# Patient Record
Sex: Male | Born: 1965 | Race: White | Hispanic: No | Marital: Married | Smoking: Never smoker
Health system: Southern US, Community
[De-identification: ages and names within clinical notes are randomized; demographics above are authoritative.]

## PROBLEM LIST (undated history)

## (undated) DIAGNOSIS — E785 Hyperlipidemia, unspecified: Secondary | ICD-10-CM

## (undated) DIAGNOSIS — J189 Pneumonia, unspecified organism: Secondary | ICD-10-CM

## (undated) DIAGNOSIS — M1990 Unspecified osteoarthritis, unspecified site: Secondary | ICD-10-CM

## (undated) DIAGNOSIS — T7840XA Allergy, unspecified, initial encounter: Secondary | ICD-10-CM

## (undated) HISTORY — DX: Unspecified osteoarthritis, unspecified site: M19.90

## (undated) HISTORY — DX: Hyperlipidemia, unspecified: E78.5

## (undated) HISTORY — DX: Allergy, unspecified, initial encounter: T78.40XA

---
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
## 1989-08-15 HISTORY — PX: DENTAL SURGERY: SHX609

## 2007-08-16 HISTORY — PX: APPENDECTOMY: SHX54

## 2014-11-28 ENCOUNTER — Other Ambulatory Visit: Payer: Self-pay | Admitting: Orthopaedic Surgery

## 2014-11-28 DIAGNOSIS — M542 Cervicalgia: Secondary | ICD-10-CM

## 2014-12-02 ENCOUNTER — Ambulatory Visit
Admission: RE | Admit: 2014-12-02 | Discharge: 2014-12-02 | Disposition: A | Discharge status: Home / Self Care | Payer: 59 | Source: Ambulatory Visit | Attending: Orthopaedic Surgery | Admitting: Orthopaedic Surgery

## 2014-12-02 DIAGNOSIS — M542 Cervicalgia: Secondary | ICD-10-CM

## 2014-12-15 ENCOUNTER — Encounter (HOSPITAL_COMMUNITY): Payer: Self-pay | Admitting: *Deleted

## 2014-12-15 ENCOUNTER — Other Ambulatory Visit (HOSPITAL_COMMUNITY): Payer: Self-pay | Admitting: Specialist

## 2014-12-15 DIAGNOSIS — M5010 Cervical disc disorder with radiculopathy, unspecified cervical region: Secondary | ICD-10-CM

## 2014-12-15 MED ORDER — MUPIROCIN 2 % EX OINT
1.0000 "application " | TOPICAL_OINTMENT | Freq: Once | CUTANEOUS | Status: AC
  Administered 2014-12-16: 1 via TOPICAL

## 2014-12-15 NOTE — Progress Notes (Signed)
No pre-op orders in EPIC. Called Dr. Otho Ket office to request orders, spoke with Judeen Hammans.

## 2014-12-15 NOTE — H&P (Addendum)
Charles Melendez is an 49 y.o. male.   CHIEF COMPLAINT:  This patient was referred by Dr. Joni Fears for evaluation of his neck and left upper extremity.   HISTORY OF PRESENT ILLNESS:  The patient is a 49 year old male complaining of neck pain with radiation to his left upper extremity.  He has been experiencing discomfort in his neck with radiation into his left arm for nearly 8 weeks.  He was placed on a Medrol Dosepak without relief.  He has noticed decreased range of motion of his neck.  Lateral bending and rotation to the left side is severely limited.  He has pain that worsens when he extends his neck and turns to the left side.  He has stiffness in the morning with radiation of the pain into the left lateral elbow, into the left lateral forearm, and into the left dorsal hand and all the fingers.  He has difficulty sleeping.  He has numbness and paresthesias into the left hand that are persistent.  He has had no injury.  He relates that he awoke with increasing discomfort and pain into the arm that worsened over the next several days and has persisted over these last 6 weeks.  He underwent an MRI scan.  This study was done 2 days ago, the results of which indicate a significant disk protrusion with an extruded fragment to the left lateral aspect of the spinal canal at the C6-C7 level affecting primarily the left C7 nerve root and the left lateral aspect of the cord.  His pain is severe.  He relates that he is having difficulties trying to sleep.  He is taking Robaxin without a great deal of relief, but it does seem to take the edge off some.  No bowel or bladder symptoms.  No gait disturbance.  He does feel some weakness in the left arm.  He has had 2 prednisone tapers and has been seen by a chiropractor in the past.  He has had no significant improvement in his pain pattern with these maneuvers.  Apparently, he has seen Dr. Melford Aase over the last 2 months, a chiropractor.  He even saw a massage  therapist in [place], [place].  The stiffness seems to be worsening in his neck.   His primary care physician is Dr. Purcell Nails.   CURRENT MEDICATIONS:  Includes vitamin, CholestOff, fish oil, CoQ10, glucosamine chondroitin sulfate daily, and a prednisone taper that he has had twice over the last 2-3 months.   PAST MEDICAL HISTORY:  Negative for heart disease or lung or kidney disease but significant for the use of glasses and negative for pulmonary disease or asthma.  Negative for ulcer disease or GERD.  He does have a history of intermittent dizzy spells he relates to the inner ear.  No urinary difficulties.  No history of HIV or AIDS.   PAST SURGICAL HISTORY:  He does have a history of a previous appendectomy.   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  He is married.  He does drink alcohol, a glass of wine per day.  He does not smoke and does not chew tobacco.  He does not take recreational drugs.  He has 3 children.   FAMILY HISTORY:  Significant for heart disease, heart attack, high blood pressure, diabetes, and arthritis.       Past Medical History  Diagnosis Date  . Pneumonia     Past Surgical History  Procedure Laterality Date  . Appendectomy  2009  .  Mouth surgery  1986    gum graft    Family History  Problem Relation Age of Onset  . Diabetes type II Father   . Thyroid disease Father    Social History:  reports that he has never smoked. He has never used smokeless tobacco. He reports that he drinks alcohol. He reports that he does not use illicit drugs.  Allergies: No Known Allergies  No prescriptions prior to admission    No results found for this or any previous visit (from the past 48 hour(s)). No results found.  Review of Systems  Constitutional: Negative for fever, chills, weight loss, malaise/fatigue and diaphoresis.  HENT: Negative for congestion, ear discharge, ear pain, hearing loss, nosebleeds, sore throat and tinnitus.   Eyes:  Negative for blurred vision, double vision, photophobia, pain, discharge and redness.  Respiratory: Negative.  Negative for cough, hemoptysis, sputum production, shortness of breath, wheezing and stridor.   Cardiovascular: Negative.  Negative for chest pain, palpitations, orthopnea, claudication, leg swelling and PND.  Gastrointestinal: Negative.  Negative for heartburn, nausea, vomiting, abdominal pain, diarrhea, constipation, blood in stool and melena.  Genitourinary: Negative for dysuria, urgency, frequency, hematuria and flank pain.  Musculoskeletal: Positive for neck pain. Negative for myalgias, back pain, joint pain and falls.  Skin: Negative for itching and rash.  Neurological: Positive for tingling, sensory change, focal weakness and weakness. Negative for dizziness, tremors, speech change, seizures, loss of consciousness and headaches.  Endo/Heme/Allergies: Negative for environmental allergies and polydipsia. Does not bruise/bleed easily.  Psychiatric/Behavioral: Negative for depression, suicidal ideas, hallucinations, memory loss and substance abuse. The patient is not nervous/anxious and does not have insomnia.     There were no vitals taken for this visit. Physical Exam  Constitutional: He is oriented to person, place, and time. He appears well-developed and well-nourished. No distress.  HENT:  Head: Normocephalic and atraumatic.  Right Ear: External ear normal.  Left Ear: External ear normal.  Nose: Nose normal.  Mouth/Throat: Oropharynx is clear and moist. No oropharyngeal exudate.  Eyes: Conjunctivae and EOM are normal. Pupils are equal, round, and reactive to light. Right eye exhibits no discharge. Left eye exhibits no discharge. No scleral icterus.  Neck: Normal range of motion. Neck supple. No JVD present. No tracheal deviation present. No thyromegaly present.  Cardiovascular: Normal rate, regular rhythm, normal heart sounds and intact distal pulses.  Exam reveals no gallop  and no friction rub.   No murmur heard. Respiratory: Effort normal and breath sounds normal. No stridor. No respiratory distress. He has no wheezes. He has no rales. He exhibits no tenderness.  GI: Soft. Bowel sounds are normal. He exhibits no distension and no mass. There is no tenderness. There is no rebound and no guarding.  Musculoskeletal: He exhibits tenderness. He exhibits no edema.  Lymphadenopathy:    He has no cervical adenopathy.  Neurological: He is alert and oriented to person, place, and time. He displays abnormal reflex. No cranial nerve deficit. He exhibits abnormal muscle tone. Coordination normal.  Skin: Skin is warm and dry. No rash noted. He is not diaphoretic. No erythema. No pallor.  Psychiatric: He has a normal mood and affect. His behavior is normal. Judgment and thought content normal.    PHYSICAL EXAMINATION:  His height is 6 feet 2 inches.  His weight is 192 pounds.  BMI is 25.3.  He is awake, alert, and oriented x4.  He is a pleasant, 49 year old male who appears younger than his stated age.  His gait  and heel and toe walk are normal.  He has restricted range of motion of the neck particularly with lateral bending and rotation to the left decreased by about 30%.  Extension of the neck is decreased by about 30-40%.  Forward bending is chin to about a finger breadth and a half from the sternum.  Lateral bending and rotation to the right is decreased by about 15-20%.  Upper extremity motor shows weakness in the left triceps, and this is 4/5.  Left extensor digitorum communis is decreased in strength, and it is 4/5.  Left wrist volar flexion strength is decreased at 4/5.  Sensory is decreased over the dorsal aspect of the left forearm into the long finger.   RADIOGRAPHS/TESTS:  Plain radiographs of the cervical spine on 11/27/2014 with AP and lateral flexion extension views were reviewed.  The AP radiograph shows the spine with a straight appearance.  Degenerative disk narrowing  is evident at the C5-C6 level with uncovertebral spurring evident of the right and left side at C5-C6.  There is some minimal uncovertebral spur on the left side at C3-C4.  The upper lung fields show no abnormality on the AP of the cervical spine of what is able to be seen.  The lateral radiograph of the cervical spine shows degenerative disk space narrowing of the C5-C6 level with an anterior spur evident.  C6-C7 was with some very mild narrowing of the disk space present.  There is some slight straightening of the normal cervical lordotic curve through the mid and lower cervical segments present.  With flexion and extension, no gross abnormal motion was noted across any of the cervical segments.  The soft tissue anterior to the cervical spine appears normal with no significant soft tissue swelling apparent on this lateral radiograph.   The MRI scan report demonstrates uncinate process hypertrophy at C5-C6 with right-sided moderate-to-marked changes and left-sided minimal foraminal narrowing.  His pain is primarily left-sided.  At C6-C7, there is a large focal left posterior lateral extruded disk with mass effect on the left lateral cord on the left side of the C7 nerve roots.  At C7-T1, there are mild degenerative changes.  At T2-T3, there is a disk bulge that touches the ventral aspect of the thecal sac without compression.  Assessment/Plan ASSESSMENT:  This patient has a subacute disk herniation on the left side at C6-C7 that is lateral and approachable from the posterior aspect of the cervical spine.  He has some mild degenerative changes over 3 segments in the cervical spine at C5-C6, C6-C7, and C7-T1.  There are also minimal spurs at C2-C3 and C3-C4.    PLAN:  With this amount of mild degenerative changes, I think that avoiding fusion surgery would be of benefit here.  I believe that the straightening that we see in the lower cervical spine perhaps is related to C5-C6 and the disk space narrowing but  also likely the effect of this protruded disk into the lateral corner of the canal, and he is assuming a position that decreases compression here which would be more of a tendency to flexion of the cervical spine.  I would recommend that Mr. Manahan go ahead and have a posterior cervical foraminotomy on the right side with removal of the herniated disk from a posterior approach as primary portions of this disk herniation are lateral and affecting primarily the C7 nerve root.  There are no long-track findings.  He has no ataxia.  I think that overall he would do  well with decompression alone without fusion.  The risks and benefits of undergoing the procedure were discussed with Mr. Molstad.  He understands, and he wishes to proceed.  The risk of infections is 1 in 300, and the risk of bleeding is minimal of perhaps a teaspoon or two of blood loss.  The risk to the nerve root is perhaps 1 in 5,000, and it is not very great.  There is, however, risks associated with any surgery.  We will go ahead and schedule the patient to undergo the procedure within the next several weeks.  OWENS,JAMES M 12/15/2014, 11:24 PM

## 2014-12-16 ENCOUNTER — Ambulatory Visit (HOSPITAL_COMMUNITY): Payer: 59

## 2014-12-16 ENCOUNTER — Observation Stay (HOSPITAL_COMMUNITY)
Admission: RE | Admit: 2014-12-16 | Discharge: 2014-12-17 | Disposition: A | Discharge status: Home / Self Care | Payer: 59 | Source: Ambulatory Visit | Attending: Specialist | Admitting: Specialist

## 2014-12-16 ENCOUNTER — Ambulatory Visit (HOSPITAL_COMMUNITY): Payer: 59 | Admitting: Certified Registered Nurse Anesthetist

## 2014-12-16 ENCOUNTER — Encounter (HOSPITAL_COMMUNITY): Payer: Self-pay | Admitting: *Deleted

## 2014-12-16 ENCOUNTER — Encounter (HOSPITAL_COMMUNITY)
Admission: RE | Disposition: A | Discharge status: Home / Self Care | Payer: Self-pay | Source: Ambulatory Visit | Attending: Specialist

## 2014-12-16 DIAGNOSIS — Z8701 Personal history of pneumonia (recurrent): Secondary | ICD-10-CM | POA: Insufficient documentation

## 2014-12-16 DIAGNOSIS — M542 Cervicalgia: Secondary | ICD-10-CM | POA: Diagnosis present

## 2014-12-16 DIAGNOSIS — M5012 Cervical disc disorder with radiculopathy, mid-cervical region: Secondary | ICD-10-CM | POA: Diagnosis not present

## 2014-12-16 DIAGNOSIS — M5010 Cervical disc disorder with radiculopathy, unspecified cervical region: Secondary | ICD-10-CM | POA: Insufficient documentation

## 2014-12-16 DIAGNOSIS — F1099 Alcohol use, unspecified with unspecified alcohol-induced disorder: Secondary | ICD-10-CM | POA: Insufficient documentation

## 2014-12-16 DIAGNOSIS — Z419 Encounter for procedure for purposes other than remedying health state, unspecified: Secondary | ICD-10-CM

## 2014-12-16 DIAGNOSIS — M5020 Other cervical disc displacement, unspecified cervical region: Secondary | ICD-10-CM | POA: Diagnosis present

## 2014-12-16 HISTORY — PX: POSTERIOR CERVICAL FUSION/FORAMINOTOMY: SHX5038

## 2014-12-16 HISTORY — DX: Pneumonia, unspecified organism: J18.9

## 2014-12-16 LAB — SURGICAL PCR SCREEN
MRSA, PCR: NEGATIVE
STAPHYLOCOCCUS AUREUS: POSITIVE — AB

## 2014-12-16 LAB — CBC
HCT: 42.2 % (ref 39.0–52.0)
Hemoglobin: 14.5 g/dL (ref 13.0–17.0)
MCH: 30.9 pg (ref 26.0–34.0)
MCHC: 34.4 g/dL (ref 30.0–36.0)
MCV: 90 fL (ref 78.0–100.0)
Platelets: 144 10*3/uL — ABNORMAL LOW (ref 150–400)
RBC: 4.69 MIL/uL (ref 4.22–5.81)
RDW: 13.2 % (ref 11.5–15.5)
WBC: 4.8 10*3/uL (ref 4.0–10.5)

## 2014-12-16 LAB — BASIC METABOLIC PANEL
Anion gap: 12 (ref 5–15)
BUN: 21 mg/dL — ABNORMAL HIGH (ref 6–20)
CO2: 24 mmol/L (ref 22–32)
CREATININE: 0.95 mg/dL (ref 0.61–1.24)
Calcium: 8.6 mg/dL — ABNORMAL LOW (ref 8.9–10.3)
Chloride: 103 mmol/L (ref 101–111)
GFR calc Af Amer: >60 mL/min (ref >60)
GFR calc non Af Amer: >60 mL/min (ref >60)
Glucose, Bld: 88 mg/dL (ref 70–99)
POTASSIUM: 4.5 mmol/L (ref 3.5–5.1)
Sodium: 139 mmol/L (ref 135–145)

## 2014-12-16 SURGERY — POSTERIOR CERVICAL FUSION/FORAMINOTOMY LEVEL 1
Anesthesia: General | Site: Neck

## 2014-12-16 MED ORDER — KETOROLAC TROMETHAMINE 30 MG/ML IJ SOLN
INTRAMUSCULAR | Status: AC
  Filled 2014-12-16: qty 1

## 2014-12-16 MED ORDER — ONDANSETRON HCL 4 MG/2ML IJ SOLN
4.0000 mg | INTRAMUSCULAR | Status: DC | PRN
Start: 2014-12-16 — End: 2014-12-17

## 2014-12-16 MED ORDER — THROMBIN 20000 UNITS EX KIT
PACK | CUTANEOUS | Status: DC | PRN
  Administered 2014-12-16: 20000 [IU] via TOPICAL

## 2014-12-16 MED ORDER — MENTHOL 3 MG MT LOZG: 1.0000 | LOZENGE | OROMUCOSAL | Status: DC | PRN

## 2014-12-16 MED ORDER — DOCUSATE SODIUM 100 MG PO CAPS
100.0000 mg | ORAL_CAPSULE | Freq: Two times a day (BID) | ORAL | Status: DC
  Administered 2014-12-16: 100 mg via ORAL
  Filled 2014-12-16: qty 1

## 2014-12-16 MED ORDER — GLYCOPYRROLATE 0.2 MG/ML IJ SOLN
INTRAMUSCULAR | Status: DC | PRN
  Administered 2014-12-16: 0.6 mg via INTRAVENOUS

## 2014-12-16 MED ORDER — BISACODYL 5 MG PO TBEC
5.0000 mg | DELAYED_RELEASE_TABLET | Freq: Every day | ORAL | Status: DC | PRN
  Filled 2014-12-16: qty 1

## 2014-12-16 MED ORDER — ROCURONIUM BROMIDE 100 MG/10ML IV SOLN
INTRAVENOUS | Status: DC | PRN
  Administered 2014-12-16: 50 mg via INTRAVENOUS

## 2014-12-16 MED ORDER — SODIUM CHLORIDE 0.9 % IJ SOLN: 3.0000 mL | INTRAMUSCULAR | Status: DC | PRN

## 2014-12-16 MED ORDER — MORPHINE SULFATE 2 MG/ML IJ SOLN
1.0000 mg | INTRAMUSCULAR | Status: DC | PRN
  Administered 2014-12-16: 2 mg via INTRAVENOUS
  Filled 2014-12-16: qty 1

## 2014-12-16 MED ORDER — POLYETHYLENE GLYCOL 3350 17 G PO PACK
17.0000 g | PACK | Freq: Every day | ORAL | Status: DC | PRN
Start: 2014-12-16 — End: 2014-12-17
  Filled 2014-12-16: qty 1

## 2014-12-16 MED ORDER — KETOROLAC TROMETHAMINE 30 MG/ML IJ SOLN
30.0000 mg | Freq: Once | INTRAMUSCULAR | Status: AC
  Administered 2014-12-16: 30 mg via INTRAVENOUS

## 2014-12-16 MED ORDER — MIDAZOLAM HCL 5 MG/5ML IJ SOLN
INTRAMUSCULAR | Status: DC | PRN
  Administered 2014-12-16: 2 mg via INTRAVENOUS

## 2014-12-16 MED ORDER — SODIUM CHLORIDE 0.9 % IJ SOLN
3.0000 mL | Freq: Two times a day (BID) | INTRAMUSCULAR | Status: DC
  Administered 2014-12-16: 3 mL via INTRAVENOUS

## 2014-12-16 MED ORDER — ONDANSETRON HCL 4 MG/2ML IJ SOLN: 4.0000 mg | Freq: Four times a day (QID) | INTRAMUSCULAR | Status: DC | PRN

## 2014-12-16 MED ORDER — BUPIVACAINE HCL 0.5 % IJ SOLN
INTRAMUSCULAR | Status: DC | PRN
  Administered 2014-12-16: 4 mL

## 2014-12-16 MED ORDER — PHENYLEPHRINE HCL 10 MG/ML IJ SOLN
INTRAMUSCULAR | Status: DC | PRN
  Administered 2014-12-16 (×4): 80 ug via INTRAVENOUS

## 2014-12-16 MED ORDER — KCL IN DEXTROSE-NACL 10-5-0.45 MEQ/L-%-% IV SOLN
INTRAVENOUS | Status: DC
  Filled 2014-12-16 (×2): qty 1000

## 2014-12-16 MED ORDER — ACETAMINOPHEN 325 MG PO TABS: 650.0000 mg | ORAL_TABLET | ORAL | Status: DC | PRN

## 2014-12-16 MED ORDER — CEFAZOLIN SODIUM-DEXTROSE 2-3 GM-% IV SOLR
INTRAVENOUS | Status: DC | PRN
  Administered 2014-12-16: 2 g via INTRAVENOUS

## 2014-12-16 MED ORDER — LACTATED RINGERS IV SOLN
INTRAVENOUS | Status: DC
  Administered 2014-12-16 (×3): via INTRAVENOUS

## 2014-12-16 MED ORDER — FENTANYL CITRATE (PF) 250 MCG/5ML IJ SOLN
INTRAMUSCULAR | Status: AC
  Filled 2014-12-16: qty 5

## 2014-12-16 MED ORDER — ONDANSETRON HCL 4 MG/2ML IJ SOLN
INTRAMUSCULAR | Status: DC | PRN
  Administered 2014-12-16: 4 mg via INTRAVENOUS

## 2014-12-16 MED ORDER — HYDROMORPHONE HCL 1 MG/ML IJ SOLN
INTRAMUSCULAR | Status: AC
  Filled 2014-12-16: qty 1

## 2014-12-16 MED ORDER — PROPOFOL 10 MG/ML IV BOLUS
INTRAVENOUS | Status: AC
  Filled 2014-12-16: qty 20

## 2014-12-16 MED ORDER — METHOCARBAMOL 1000 MG/10ML IJ SOLN
500.0000 mg | Freq: Four times a day (QID) | INTRAVENOUS | Status: DC | PRN
  Filled 2014-12-16: qty 5

## 2014-12-16 MED ORDER — FLEET ENEMA 7-19 GM/118ML RE ENEM
1.0000 | ENEMA | Freq: Once | RECTAL | Status: AC | PRN
  Filled 2014-12-16: qty 1

## 2014-12-16 MED ORDER — MUPIROCIN 2 % EX OINT
TOPICAL_OINTMENT | CUTANEOUS | Status: AC
  Filled 2014-12-16: qty 22

## 2014-12-16 MED ORDER — ACETAMINOPHEN 650 MG RE SUPP: 650.0000 mg | RECTAL | Status: DC | PRN

## 2014-12-16 MED ORDER — PROPOFOL 10 MG/ML IV BOLUS
INTRAVENOUS | Status: DC | PRN
  Administered 2014-12-16: 200 mg via INTRAVENOUS

## 2014-12-16 MED ORDER — BUPIVACAINE LIPOSOME 1.3 % IJ SUSP
20.0000 mL | Freq: Once | INTRAMUSCULAR | Status: AC
  Administered 2014-12-16: 4 mL
  Filled 2014-12-16: qty 20

## 2014-12-16 MED ORDER — THROMBIN 20000 UNITS EX SOLR
CUTANEOUS | Status: AC
  Filled 2014-12-16: qty 20000

## 2014-12-16 MED ORDER — HYDROMORPHONE HCL 1 MG/ML IJ SOLN
0.2500 mg | INTRAMUSCULAR | Status: DC | PRN
  Administered 2014-12-16: 0.25 mg via INTRAVENOUS

## 2014-12-16 MED ORDER — LIDOCAINE HCL (CARDIAC) 20 MG/ML IV SOLN
INTRAVENOUS | Status: DC | PRN
  Administered 2014-12-16: 60 mg via INTRAVENOUS

## 2014-12-16 MED ORDER — MIDAZOLAM HCL 2 MG/2ML IJ SOLN
INTRAMUSCULAR | Status: AC
  Filled 2014-12-16: qty 2

## 2014-12-16 MED ORDER — PHENYLEPHRINE HCL 10 MG/ML IJ SOLN
10.0000 mg | INTRAMUSCULAR | Status: DC | PRN
  Administered 2014-12-16: 25 ug/min via INTRAVENOUS

## 2014-12-16 MED ORDER — HYDROCODONE-ACETAMINOPHEN 5-325 MG PO TABS: 1.0000 | ORAL_TABLET | ORAL | Status: DC | PRN

## 2014-12-16 MED ORDER — FENTANYL CITRATE (PF) 100 MCG/2ML IJ SOLN
INTRAMUSCULAR | Status: DC | PRN
  Administered 2014-12-16: 150 ug via INTRAVENOUS
  Administered 2014-12-16: 100 ug via INTRAVENOUS

## 2014-12-16 MED ORDER — CEFAZOLIN SODIUM 1-5 GM-% IV SOLN
1.0000 g | Freq: Three times a day (TID) | INTRAVENOUS | Status: AC
  Administered 2014-12-16 – 2014-12-17 (×2): 1 g via INTRAVENOUS
  Filled 2014-12-16 (×2): qty 50

## 2014-12-16 MED ORDER — OXYCODONE-ACETAMINOPHEN 5-325 MG PO TABS
1.0000 | ORAL_TABLET | ORAL | Status: DC | PRN
  Administered 2014-12-16 – 2014-12-17 (×4): 2 via ORAL
  Filled 2014-12-16 (×4): qty 2

## 2014-12-16 MED ORDER — METHOCARBAMOL 500 MG PO TABS
500.0000 mg | ORAL_TABLET | Freq: Four times a day (QID) | ORAL | Status: DC | PRN
  Administered 2014-12-16 – 2014-12-17 (×3): 500 mg via ORAL
  Filled 2014-12-16 (×3): qty 1

## 2014-12-16 MED ORDER — OXYCODONE HCL 5 MG/5ML PO SOLN: 5.0000 mg | Freq: Once | ORAL | Status: DC | PRN

## 2014-12-16 MED ORDER — VECURONIUM BROMIDE 10 MG IV SOLR
INTRAVENOUS | Status: DC | PRN
  Administered 2014-12-16: 1 mg via INTRAVENOUS
  Administered 2014-12-16: 3 mg via INTRAVENOUS

## 2014-12-16 MED ORDER — OXYCODONE HCL 5 MG PO TABS: 5.0000 mg | ORAL_TABLET | Freq: Once | ORAL | Status: DC | PRN

## 2014-12-16 MED ORDER — PHENOL 1.4 % MT LIQD: 1.0000 | OROMUCOSAL | Status: DC | PRN

## 2014-12-16 MED ORDER — NEOSTIGMINE METHYLSULFATE 10 MG/10ML IV SOLN
INTRAVENOUS | Status: DC | PRN
  Administered 2014-12-16: 5 mg via INTRAVENOUS

## 2014-12-16 MED ORDER — SODIUM CHLORIDE 0.9 % IV SOLN: 250.0000 mL | INTRAVENOUS | Status: DC

## 2014-12-16 SURGICAL SUPPLY — 58 items
BIT DRILL NEURO 2X3.1 SFT TUCH (MISCELLANEOUS) IMPLANT
BLADE SURG ROTATE 9660 (MISCELLANEOUS) IMPLANT
BUR ROUND FLUTED 4 SOFT TCH (BURR) ×2 IMPLANT
BUR ROUND FLUTED 4MM SOFT TCH (BURR) ×1
BUR SABER RD CUTTING 3.0 (BURR) ×2 IMPLANT
BUR SABER RD CUTTING 3.0MM (BURR) ×1
CANISTER SUCTION 2500CC (MISCELLANEOUS) ×3 IMPLANT
COLLAR CERV LO CONTOUR FIRM DE (SOFTGOODS) ×3 IMPLANT
COVER SURGICAL LIGHT HANDLE (MISCELLANEOUS) ×3 IMPLANT
DERMABOND ADVANCED (GAUZE/BANDAGES/DRESSINGS) ×2
DERMABOND ADVANCED .7 DNX12 (GAUZE/BANDAGES/DRESSINGS) ×1 IMPLANT
DRAPE C-ARM 42X72 X-RAY (DRAPES) ×3 IMPLANT
DRAPE LAPAROTOMY T 102X78X121 (DRAPES) ×3 IMPLANT
DRAPE MICROSCOPE LEICA (MISCELLANEOUS) IMPLANT
DRAPE PROXIMA HALF (DRAPES) ×6 IMPLANT
DRAPE SURG 17X23 STRL (DRAPES) ×12 IMPLANT
DRILL NEURO 2X3.1 SOFT TOUCH (MISCELLANEOUS)
DRSG MEPILEX BORDER 4X4 (GAUZE/BANDAGES/DRESSINGS) ×3 IMPLANT
DRSG MEPILEX BORDER 4X8 (GAUZE/BANDAGES/DRESSINGS) IMPLANT
DRSG PAD ABDOMINAL 8X10 ST (GAUZE/BANDAGES/DRESSINGS) ×6 IMPLANT
DURAPREP 6ML APPLICATOR 50/CS (WOUND CARE) ×3 IMPLANT
ELECT CAUTERY BLADE 6.4 (BLADE) ×3 IMPLANT
ELECT REM PT RETURN 9FT ADLT (ELECTROSURGICAL) ×3
ELECTRODE REM PT RTRN 9FT ADLT (ELECTROSURGICAL) ×1 IMPLANT
EVACUATOR 1/8 PVC DRAIN (DRAIN) IMPLANT
GAUZE SPONGE 4X4 12PLY STRL (GAUZE/BANDAGES/DRESSINGS) ×3 IMPLANT
GLOVE BIOGEL PI IND STRL 8 (GLOVE) ×1 IMPLANT
GLOVE BIOGEL PI INDICATOR 8 (GLOVE) ×2
GLOVE ECLIPSE 9.0 STRL (GLOVE) ×3 IMPLANT
GLOVE ORTHO TXT STRL SZ7.5 (GLOVE) ×3 IMPLANT
GLOVE SURG 8.5 LATEX PF (GLOVE) ×3 IMPLANT
GOWN STRL REUS W/ TWL LRG LVL3 (GOWN DISPOSABLE) ×1 IMPLANT
GOWN STRL REUS W/TWL 2XL LVL3 (GOWN DISPOSABLE) ×6 IMPLANT
GOWN STRL REUS W/TWL LRG LVL3 (GOWN DISPOSABLE) ×2
KIT BASIN OR (CUSTOM PROCEDURE TRAY) ×3 IMPLANT
KIT ROOM TURNOVER OR (KITS) ×3 IMPLANT
NEEDLE SPNL 18GX3.5 QUINCKE PK (NEEDLE) ×3 IMPLANT
NS IRRIG 1000ML POUR BTL (IV SOLUTION) ×3 IMPLANT
PACK ORTHO CERVICAL (CUSTOM PROCEDURE TRAY) ×3 IMPLANT
PAD ARMBOARD 7.5X6 YLW CONV (MISCELLANEOUS) ×6 IMPLANT
PATTIES SURGICAL .25X.25 (GAUZE/BANDAGES/DRESSINGS) ×3 IMPLANT
PATTIES SURGICAL .75X.75 (GAUZE/BANDAGES/DRESSINGS) IMPLANT
SPONGE SURGIFOAM ABS GEL 100 (HEMOSTASIS) ×3 IMPLANT
SUT ETHIBOND CT1 BRD #0 30IN (SUTURE) IMPLANT
SUT ETHIBOND NAB CT1 #1 30IN (SUTURE) ×3 IMPLANT
SUT VIC AB 0 CT1 27 (SUTURE)
SUT VIC AB 0 CT1 27XBRD ANBCTR (SUTURE) IMPLANT
SUT VIC AB 2-0 CT1 27 (SUTURE)
SUT VIC AB 2-0 CT1 TAPERPNT 27 (SUTURE) IMPLANT
SUT VIC AB 2-0 UR6 27 (SUTURE) IMPLANT
SUT VIC AB 3-0 SH 27 (SUTURE) ×2
SUT VIC AB 3-0 SH 27X BRD (SUTURE) ×1 IMPLANT
SUT VICRYL 0 UR6 27IN ABS (SUTURE) ×3 IMPLANT
TAPE CLOTH 4X10 WHT NS (GAUZE/BANDAGES/DRESSINGS) ×3 IMPLANT
TOWEL OR 17X24 6PK STRL BLUE (TOWEL DISPOSABLE) ×3 IMPLANT
TOWEL OR 17X26 10 PK STRL BLUE (TOWEL DISPOSABLE) ×3 IMPLANT
TRAY FOLEY CATH 16FRSI W/METER (SET/KITS/TRAYS/PACK) IMPLANT
WATER STERILE IRR 1000ML POUR (IV SOLUTION) IMPLANT

## 2014-12-16 NOTE — Interval H&P Note (Signed)
History and Physical Interval Note:  12/16/2014 10:53 AM  Charles Melendez  has presented today for surgery, with the diagnosis of left C6-7 herniated nucleus pulposus  The various methods of treatment have been discussed with the patient and family. After consideration of risks, benefits and other options for treatment, the patient has consented to  Procedure(s): LEFT C6-7 POSTERIOR CERVICAL FORAMINOTOMY WITH EXCISION OF HERNIATED DISC (N/A) as a surgical intervention .  The patient's history has been reviewed, patient examined, no change in status, stable for surgery.  I have reviewed the patient's chart and labs.  Questions were answered to the patient's satisfaction.     [name] E

## 2014-12-16 NOTE — Discharge Instructions (Signed)
No lifting greater than 10 lbs. °Avoid bending, stooping and twisting. °Walking in house for first week then may start to get out slowly increasing activity using arms. °Keep incision dry for 3 days, may use tegaderm or similar water impervious dressing. °Avoid overhead use of arms and overhead lifting. °Wear collar for comfort. °Use ice as needed for comfort. °

## 2014-12-16 NOTE — Op Note (Signed)
12/16/2014  1:20 PM  PATIENT:  Charles Melendez  49 y.o. male  MRN: [id number]  OPERATIVE REPORT  PRE-OPERATIVE DIAGNOSIS:  left C6-7 herniated nucleus pulposus  POST-OPERATIVE DIAGNOSIS:  left C6-7 herniated nucleus pulposus  PROCEDURE:  Procedure(s): LEFT C6-7 POSTERIOR CERVICAL FORAMINOTOMY WITH EXCISION OF HERNIATED DISC OR Microscope.    SURGEON:  Jessy Oto, MD     ASSISTANT:  Benjiman Core, PA-C  (Present throughout the entire procedure and necessary for completion of procedure in a timely manner)     ANESTHESIA:  General,supplemented with local marcaine 0.5% 1:1 exparel 1.3% total 30 cc. Dr. Marcie Bal  EBL: 75 cc.  DRAINS: None.    COMPLICATIONS:  None.   PROCEDURE:The patient was met in the holding area, and the appropriate left C6-7 cervical level identified and marked with "x" and my initials. All questions were answered and informed consent signed.   The patient was then transported to OR. The patient was then placed under general anesthesia without difficulty.Transferred to the operating room table prone position Mayfield horseshoe with chest rolls. All pressure points well-padded PAS stockings.Shoulders taped down and skin over the posterior inferior aspect of the neck place in traction to decrease skin folds. The patient received appropriate preoperative antibiotic 2 grams ancef. prophylaxis.Time-out procedure was called and correct.  Sterile prep with DuraPrep and draped in the usual manner the shoulders were taped downwards and skin traction over the skin of the neck. Following DuraPrep draped in the usual manner. After timeout protocol, a 25 gage needle inserted and C-arm demonstrated this at the C6 skin level, incision was made approximately C6 in the midline extending distally 3 cm. This following infiltration of skin and subcutaneous layers withmarcaine 0.5% 1:1 exparel 1.3% total of 20 cc. Incision carried through skin and subcutaneous layers using 10 blade scalpel  and electrocautery down to the level ligamentum nuchae. Incision made along the lateral aspect of the spinous process of C6. Clamp then placed at the spinous process of C6. Intraoperative C-arm fluoroscopy identified the clamp at the inferior C6 spinous process level. Then a marking pen was used to mark the spinous process of C6. Electrocautery then used to carefully incise the cervical muscles off the left lateral aspect of the spinous process of C6 and C7. Dividing the  spinous muscles off of the inferior aspect lamina at C6 exposing the C6-7 posterior aspect of the interlaminar space. Further infiltration of the ligamentumnuche and paracervical muscles with marcaine 0.5% 1:1 exparel 1.3% 10 cc. The magnification headlamp were used during this portion procedure. Boss McCollough retractor was inserted. High-speed bur was used to remove a small portion of bone from the inferior aspect of lamina of C7 and the medial 20% of the inferior articular process of C6. Further thinning the superior aspect of the lamina left C7. A 1 mm Kerrison was then used to remove him from superior aspect of the lamina C7 and the medial aspect of the intra-articular process of C6 the 20%, exposing the superior articular process of C7. A 1 mm Kerrison was removed bone off the medial aspect of the superior articular process of C7 resecting 20% of the medial aspect of the superior articular process of C7. Ligamentum flavum then easily lifted superiorly  electrocautery unit cauterizing epidural veins deep to the ligamentum flavum  then resecting the ligamentum flavum. The operating room microscope was draped sterilely and brought into the field. Under the operating room microscope the epidural vein layer overlying the posterior aspect of the thecal  sac and the C7 nerve root was then carefully lifted using a micro-titanium nerve hook then cauterized using bipolar electrocautery a # 15 blade scalpel then used to incise this overlying the C7  nerve root releasing the vascular leash a backward angle 3-0 microcurette then used to remove a small portion of bone off the superior and medial aspect of the pedicle further mobilizing the C7 nerve root bipolar electrocautery to control all bleeding within the axillary area of the C7 nerve. Bone wax was applied to bleeding cancellus bone surfaces are excellent hemostasis obtained  The disc explored using a Penfield 4 found to be protruded. 11 blade scalpel used to incise the disc mild disc material found to be herniated and uncovertebral joint found to be prominent resulting in right C7 foramenal narrowing.Three large fragments of disc material removed with micropituitary.  Following this then hemostasis was obtained using thrombin-soaked Gelfoam and micro-pledgettes. When complete hemostasis was obtained all gel foam was removed the nerve hook could be easily passed out the neuroforamen without the lateral aspect of the C7 pedicle demonstrate the C7 neuroforamen completely decompressed. Irrigation was carried out no active bleeding was present. The incision was closed by approximating the ligamentum nuchae with O ethibond sutures. The subcutaneous layers approximated with interrupted 0 Vicryl suture more superficial layers with interrupted 2-0 Vicryl sutures and the skin closed with interrupted 4-0 Vicryl sutures. Dermabond was applied then MedPlex bandage. Soft cervical collar placed.  All instrument and sponge counts were correct. Patient was then returned to supine position on his stretcher. Returned to recovery room in satisfactory condition.    Physician assistant's responsibilities: Benjiman Core, PA-C perform the duties of assistant physician and surgeon during this case present from the beginning of the case to the end of the case. He assisted with careful retraction of neural structures suctioning about her elements including cervical cord and C7 nerve root. Performed closure of the incision on the  ligamentum nuchae to the skin and application of dressing. He assisted in positioning the patient had removal the patient from the OR table to the stretcher.     NITKA,JAMES E 12/16/2014, 1:20 PM

## 2014-12-16 NOTE — Anesthesia Preprocedure Evaluation (Signed)
Anesthesia Evaluation  Patient identified by MRN, date of birth, ID band Patient awake    Reviewed: Allergy & Precautions, NPO status , Patient's Chart, lab work & pertinent test results  Airway Mallampati: II   Neck ROM: full    Dental   Pulmonary neg pulmonary ROS,  breath sounds clear to auscultation        Cardiovascular negative cardio ROS  Rhythm:regular Rate:Normal     Neuro/Psych    GI/Hepatic   Endo/Other    Renal/GU      Musculoskeletal   Abdominal   Peds  Hematology   Anesthesia Other Findings   Reproductive/Obstetrics                             Anesthesia Physical Anesthesia Plan  ASA: I  Anesthesia Plan: General   Post-op Pain Management:    Induction: Intravenous  Airway Management Planned: Oral ETT  Additional Equipment:   Intra-op Plan:   Post-operative Plan: Extubation in OR  Informed Consent: I have reviewed the patients History and Physical, chart, labs and discussed the procedure including the risks, benefits and alternatives for the proposed anesthesia with the patient or authorized representative who has indicated his/her understanding and acceptance.     Plan Discussed with: CRNA, Anesthesiologist and Surgeon  Anesthesia Plan Comments:         Anesthesia Quick Evaluation

## 2014-12-16 NOTE — Transfer of Care (Signed)
Immediate Anesthesia Transfer of Care Note  Patient: Charles Melendez  Procedure(s) Performed: Procedure(s): LEFT C6-7 POSTERIOR CERVICAL FORAMINOTOMY WITH EXCISION OF HERNIATED DISC (N/A)  Patient Location: PACU  Anesthesia Type:General  Level of Consciousness: awake, alert , oriented and patient cooperative  Airway & Oxygen Therapy: Patient Spontanous Breathing and Patient connected to nasal cannula oxygen  Post-op Assessment: Report given to RN, Post -op Vital signs reviewed and stable, Patient moving all extremities, Patient moving all extremities X 4 and Patient able to stick tongue midline  Post vital signs: Reviewed and stable  Last Vitals:  Filed Vitals:   12/16/14 0845  BP: 144/90  Pulse: 67  Temp: 37 C  Resp: 18    Complications: No apparent anesthesia complications

## 2014-12-16 NOTE — Anesthesia Procedure Notes (Signed)
Procedure Name: Intubation Date/Time: 12/16/2014 11:05 AM Performed by: Shirlyn Goltz Pre-anesthesia Checklist: Patient identified, Emergency Drugs available, Suction available and Patient being monitored Patient Re-evaluated:Patient Re-evaluated prior to inductionOxygen Delivery Method: Circle system utilized Preoxygenation: Pre-oxygenation with 100% oxygen Intubation Type: IV induction Ventilation: Mask ventilation without difficulty and Oral airway inserted - appropriate to patient size Laryngoscope Size: Mac and 4 Grade View: Grade I Tube type: Oral Tube size: 7.5 mm Number of attempts: 1 Airway Equipment and Method: Stylet Placement Confirmation: ETT inserted through vocal cords under direct vision,  positive ETCO2 and breath sounds checked- equal and bilateral Secured at: 22 cm Tube secured with: Tape Dental Injury: Teeth and Oropharynx as per pre-operative assessment

## 2014-12-16 NOTE — Brief Op Note (Addendum)
12/16/2014  1:15 PM  PATIENT:  Charles Melendez  49 y.o. male  PRE-OPERATIVE DIAGNOSIS:  left C6-7 herniated nucleus pulposus  POST-OPERATIVE DIAGNOSIS:  left C6-7 herniated nucleus pulposus  PROCEDURE:  Procedure(s): LEFT C6-7 POSTERIOR CERVICAL FORAMINOTOMY WITH EXCISION OF HERNIATED DISC (N/A)  SURGEON:  Surgeon(s) and Role:    * Jessy Oto, MD - Primary  PHYSICIAN ASSISTANT: Benjiman Core, PA-C   ANESTHESIA:   local and general, Dr.Hodierne.  EBL:  Total I/O In: 1000 [I.V.:1000] Out: 100 [Blood:100]  BLOOD ADMINISTERED:none  DRAINS: none   LOCAL MEDICATIONS USED:  MARCAINE 1/2% 1:1 EXPAREL 1.3% Amount:30 ml  SPECIMEN:  No Specimen  DISPOSITION OF SPECIMEN:  N/A  COUNTS:  YES  TOURNIQUET:  * No tourniquets in log *  DICTATION: .Dragon Dictation  PLAN OF CARE: Admit for overnight observation  PATIENT DISPOSITION:  PACU - hemodynamically stable.   Delay start of Pharmacological VTE agent (>24hrs) due to surgical blood loss or risk of bleeding: yes

## 2014-12-16 NOTE — Progress Notes (Signed)
Orthopedic Tech Progress Note Patient Details:  Charles Melendez 04-13-1966 [id number] Patient already has collar. Patient ID: Charles Melendez, male   DOB: 16-Oct-1965, 49 y.o.   MRN: [id number]   Charles Melendez 12/16/2014, 4:07 PM

## 2014-12-16 NOTE — Anesthesia Postprocedure Evaluation (Signed)
  Anesthesia Post-op Note  Patient: Charles Melendez  Procedure(s) Performed: Procedure(s) (LRB): LEFT C6-7 POSTERIOR CERVICAL FORAMINOTOMY WITH EXCISION OF HERNIATED DISC (N/A)  Patient Location: PACU  Anesthesia Type: General  Level of Consciousness: awake and alert   Airway and Oxygen Therapy: Patient Spontanous Breathing  Post-op Pain: mild  Post-op Assessment: Post-op Vital signs reviewed, Patient's Cardiovascular Status Stable, Respiratory Function Stable, Patent Airway and No signs of Nausea or vomiting  Last Vitals:  Filed Vitals:   12/16/14 1333  BP: 125/81  Pulse: 62  Temp: 36.6 C  Resp: 17    Post-op Vital Signs: stable   Complications: No apparent anesthesia complications

## 2014-12-17 ENCOUNTER — Encounter (HOSPITAL_COMMUNITY): Payer: Self-pay | Admitting: Specialist

## 2014-12-17 DIAGNOSIS — M5012 Cervical disc disorder with radiculopathy, mid-cervical region: Secondary | ICD-10-CM | POA: Diagnosis not present

## 2014-12-17 MED ORDER — METHOCARBAMOL 500 MG PO TABS: 500.0000 mg | ORAL_TABLET | Freq: Four times a day (QID) | ORAL | Status: DC | PRN

## 2014-12-17 MED ORDER — OXYCODONE-ACETAMINOPHEN 5-325 MG PO TABS: 1.0000 | ORAL_TABLET | ORAL | Status: DC | PRN

## 2014-12-17 MED ORDER — MUPIROCIN 2 % EX OINT: 1.0000 "application " | TOPICAL_OINTMENT | Freq: Two times a day (BID) | CUTANEOUS | Status: DC

## 2014-12-17 MED ORDER — DOCUSATE SODIUM 100 MG PO CAPS: 100.0000 mg | ORAL_CAPSULE | Freq: Two times a day (BID) | ORAL | Status: DC

## 2014-12-17 MED FILL — Thrombin For Soln 20000 Unit: CUTANEOUS | Qty: 1 | Status: AC

## 2014-12-17 NOTE — Progress Notes (Signed)
Pt doing well. Pt given D/C instructions with Rx's, verbal understanding was provided. Pt's incision is covered with dressing and has no sign of infection. Pt's IV was removed prior to D/C. Pt D/C'd home via walking @ 1045 per MD order. Pt is stable @ D/C and has no other needs at this time. Holli Humbles, RN

## 2014-12-17 NOTE — Discharge Summary (Signed)
Physician Discharge Summary      Patient ID: Charles Melendez MRN: [id number] DOB/AGE: 1965/12/11 49 y.o.  Admit date: 12/16/2014 Discharge date:   Admission Diagnoses:  Principal Problem:   Herniation of cervical intervertebral disc with radiculopathy Active Problems:   HNP (herniated nucleus pulposus), cervical   Discharge Diagnoses:  Same  Past Medical History  Diagnosis Date  . Pneumonia     Surgeries: Procedure(s): LEFT C6-7 POSTERIOR CERVICAL FORAMINOTOMY WITH EXCISION OF HERNIATED DISC on 12/16/2014   Consultants:    Discharged Condition: Improved  Hospital Course: Charles Melendez is an 49 y.o. male who was admitted 12/16/2014 with a chief complaint of No chief complaint on file. , and found to have a diagnosis of Herniation of cervical intervertebral disc with radiculopathy.  He was brought to the operating room on 12/16/2014 and underwent the above named procedures.    He was given perioperative antibiotics:  Anti-infectives    Start     Dose/Rate Route Frequency Ordered Stop   12/16/14 1830  ceFAZolin (ANCEF) IVPB 1 g/50 mL premix     1 g 100 mL/hr over 30 Minutes Intravenous Every 8 hours 12/16/14 1529 12/17/14 0351    .  He was given sequential compression devices, early ambulation, and chemoprophylaxis for DVT prophylaxis. POD#1 alert and orientedx 4, motor normal, incision dry, voiding without difficulty, no narcotics requested or needed since 3AM. Discharged home on POD#1. All questions answered prior to discharge. He benefited maximally from their hospital stay and there were no complications.    Recent vital signs:  Filed Vitals:   12/17/14 0400  BP: 98/66  Pulse: 65  Temp: 98.1 F (36.7 C)  Resp: 20    Recent laboratory studies:  Results for orders placed or performed during the hospital encounter of 12/16/14  Surgical pcr screen  Result Value Ref Range   MRSA, PCR NEGATIVE NEGATIVE   Staphylococcus aureus POSITIVE (A) NEGATIVE  CBC    Result Value Ref Range   WBC 4.8 4.0 - 10.5 K/uL   RBC 4.69 4.22 - 5.81 MIL/uL   Hemoglobin 14.5 13.0 - 17.0 g/dL   HCT 42.2 39.0 - 52.0 %   MCV 90.0 78.0 - 100.0 fL   MCH 30.9 26.0 - 34.0 pg   MCHC 34.4 30.0 - 36.0 g/dL   RDW 13.2 11.5 - 15.5 %   Platelets 144 (L) 150 - 400 K/uL  Basic metabolic panel  Result Value Ref Range   Sodium 139 135 - 145 mmol/L   Potassium 4.5 3.5 - 5.1 mmol/L   Chloride 103 101 - 111 mmol/L   CO2 24 22 - 32 mmol/L   Glucose, Bld 88 70 - 99 mg/dL   BUN 21 (H) 6 - 20 mg/dL   Creatinine, Ser 0.95 0.61 - 1.24 mg/dL   Calcium 8.6 (L) 8.9 - 10.3 mg/dL   GFR calc non Af Amer >60 >60 mL/min   GFR calc Af Amer >60 >60 mL/min   Anion gap 12 5 - 15    Discharge Medications:     Medication List    STOP taking these medications        HYDROcodone-acetaminophen 5-325 MG per tablet  Commonly known as:  NORCO/VICODIN      TAKE these medications        CHOLEST OFF PO  Take 1 Dose by mouth daily.     COQ10 PO  Take 1 tablet by mouth daily.     docusate sodium 100 MG capsule  Commonly  known as:  COLACE  Take 1 capsule (100 mg total) by mouth 2 (two) times daily.     Fish Oil 1000 MG Caps  Take 1,000 mg by mouth daily.     glucosamine-chondroitin 500-400 MG tablet  Take 1 tablet by mouth daily.     methocarbamol 500 MG tablet  Commonly known as:  ROBAXIN  Take 500 mg by mouth every 6 (six) hours as needed. spasms     methocarbamol 500 MG tablet  Commonly known as:  ROBAXIN  Take 1 tablet (500 mg total) by mouth every 6 (six) hours as needed for muscle spasms.     multivitamin with minerals tablet  Take 1 tablet by mouth daily.     oxyCODONE-acetaminophen 5-325 MG per tablet  Commonly known as:  PERCOCET/ROXICET  Take 1-2 tablets by mouth every 4 (four) hours as needed for moderate pain.        Diagnostic Studies: Dg Cervical Spine 2-3 Views  12/16/2014   CLINICAL DATA:  Confirm C6 marker location  EXAM: CERVICAL SPINE - 2-3 VIEW   COMPARISON:  None.  FINDINGS: Two intraoperative spot images are submitted. These demonstrate a posterior surgical instrument which appears to be along the inferior aspect of the C6 spinous process. Overlying shoulders obscures the C6 and C7 levels.  IMPRESSION: Posterior surgical instrument appears to be along the inferior aspect of C6 spinous process.   Electronically Signed   By: Rolm Baptise M.D.   On: 12/16/2014 12:12   Mr Cervical Spine Wo Contrast  12/02/2014   CLINICAL DATA:  49 year old male with neck and left shoulder tightness. Left arm pain. Numbness and tingling left arm and hand. Initial encounter.  EXAM: MRI CERVICAL SPINE WITHOUT CONTRAST  TECHNIQUE: Multiplanar, multisequence MR imaging of the cervical spine was performed. No intravenous contrast was administered.  COMPARISON:  None.  FINDINGS: Cervical medullary junction and visualized intracranial structures unremarkable. No focal cervical cord signal abnormality.  Mild kyphosis of the cervical spine centered at the C5-6 level.  Visualized paravertebral structures are unremarkable.  C2-3: Minimal spur left posterior lateral position with minimal narrowing origin of the left neural foramen.  C3-4: Minimal bulge. Minimal narrowing ventral aspect of the thecal sac. Minimal uncinate hypertrophy. Mild left facet joint degenerative changes. Mild to moderate left foraminal narrowing. Minimal right foraminal narrowing.  C4-5:  Minimal bulge.  C5-6: Disc degeneration with anterior osteophyte. Broad-based disc osteophyte complex posteriorly with narrowing of the ventral aspect of the thecal sac with minimal cord contact without significant compression. Uncinate hypertrophy greater on the right with moderate to marked right-sided and minimal left-sided foraminal narrowing.  C6-7: Large focal left posterior lateral extruded disc with mass effect upon the left lateral aspect of the cord and left nerve roots with narrowing of the origin of the left neural  foramen. This elevates the posterior longitudinal ligament. Minimal right foraminal narrowing.  C7-T1: Mild facet joint degenerative changes. No significant spinal stenosis or foraminal narrowing.  T1-2:  Negative.  T2-3: Bulge with mild narrowing ventral aspect of the thecal sac approaching the cord. Axial images not obtained.  IMPRESSION: In this patient with left-sided symptoms, most notable left-sided findings are at the C6-7 level followed by the C3-4 level. Most notable right-sided findings at the C5-6 level. Summary of pertinent findings includes:  C6-7 large focal left posterior lateral extruded disc with mass effect upon the left lateral aspect of the cord and left nerve roots with narrowing of the origin of the left neural  foramen.  C3-4 minimal bulge. Minimal narrowing ventral aspect of the thecal sac. Minimal uncinate hypertrophy. Mild left facet joint degenerative changes. Mild to moderate left foraminal narrowing. Minimal right foraminal narrowing.  C2-3 minimal narrowing origin of the left neural foramen.  C5-6 broad-based disc osteophyte complex with narrowing of the ventral aspect of the thecal sac with minimal cord contact without significant compression. Uncinate hypertrophy greater on the right with moderate to marked right-sided and minimal left-sided foraminal narrowing.  T2-3 bulge.  These results were called by telephone at the time of interpretation on 12/02/2014 at 4:35 pm to Dr. Joni Fears , who verbally acknowledged these results.   Electronically Signed   By: Genia Del M.D.   On: 12/02/2014 16:36    Disposition: Final discharge disposition not confirmed      Discharge Instructions    Call MD / Call 911    Complete by:  As directed   If you experience chest pain or shortness of breath, CALL 911 and be transported to the hospital emergency room.  If you develope a fever above 101 F, pus (white drainage) or increased drainage or redness at the wound, or calf pain, call your  surgeon's office.     Constipation Prevention    Complete by:  As directed   Drink plenty of fluids.  Prune juice may be helpful.  You may use a stool softener, such as Colace (over the counter) 100 mg twice a day.  Use MiraLax (over the counter) for constipation as needed.     Diet - low sodium heart healthy    Complete by:  As directed      Discharge instructions    Complete by:  As directed   No lifting greater than 10 lbs. Avoid bending, stooping and twisting. Walking in house for first week then may start to get out slowly increasing activity using arms. Keep incision dry for 3 days, may use tegaderm or similar water impervious dressing. Avoid overhead use of arms and overhead lifting. Wear collar for comfort. Use ice as needed for comfort.     Driving restrictions    Complete by:  As directed   No driving for 2 weeks     Increase activity slowly as tolerated    Complete by:  As directed      Lifting restrictions    Complete by:  As directed   No lifting for 6 weeks           Follow-up Information    Follow up with Lama Narayanan E, MD In 2 weeks.   Specialty:  Orthopedic Surgery   Contact information:   [address] [place] [address] [phone]        Signed: Jessy Oto 12/17/2014, 8:33 AM

## 2014-12-17 NOTE — Progress Notes (Signed)
Patient ID: Charles Melendez, male   DOB: Jan 21, 1966, 49 y.o.   MRN: [id number]     Subjective: 1 Day Post-Op Procedure(s) (LRB): LEFT C6-7 POSTERIOR CERVICAL FORAMINOTOMY WITH EXCISION OF HERNIATED DISC (N/A) Awake, alert and oriented x 4. Still some numbness left long finger. Arm feels the best in months. Patient reports pain as mild.    Objective:   VITALS:  Temp:  [97.5 F (36.4 C)-98.8 F (37.1 C)] 98.1 F (36.7 C) (05/04 0400) Pulse Rate:  [48-78] 65 (05/04 0400) Resp:  [13-20] 20 (05/04 0400) BP: (98-144)/(66-90) 98/66 mmHg (05/04 0400) SpO2:  [97 %-100 %] 97 % (05/04 0400) Weight:  [89.359 kg (197 lb)] 89.359 kg (197 lb) (05/03 0845)  Neurologically intact ABD soft Neurovascular intact Intact pulses distally Dorsiflexion/Plantar flexion intact Incision: no drainage   LABS  Recent Labs  12/16/14 0918  HGB 14.5  WBC 4.8  PLT 144*    Recent Labs  12/16/14 0918  NA 139  K 4.5  CL 103  CO2 24  BUN 21*  CREATININE 0.95  GLUCOSE 88   No results for input(s): LABPT, INR in the last 72 hours.   Assessment/Plan: 1 Day Post-Op Procedure(s) (LRB): LEFT C6-7 POSTERIOR CERVICAL FORAMINOTOMY WITH EXCISION OF HERNIATED DISC (N/A)   Advance diet Up with therapy Discharge home   Analia Zuk E 12/17/2014, 8:27 AM

## 2014-12-17 NOTE — Evaluation (Signed)
Physical Therapy Evaluation Patient Details Name: Charles Melendez MRN: [id number] DOB: Jun 11, 1966 Today's Date: 12/17/2014   History of Present Illness  s/p C6-7 discectomy and foraminotomy (3 month h/o Lt arm pain/paresthesias  Clinical Impression  Patient evaluated by Physical Therapy with no further acute PT needs identified. All education has been completed and the patient has no further questions. PT is signing off. Thank you for this referral.     Follow Up Recommendations No PT follow up    Equipment Recommendations  None recommended by PT    Recommendations for Other Services OT consult (pt asking about donning socks and shoes)     Precautions / Restrictions Precautions Precautions: Cervical Required Braces or Orthoses: Cervical Brace Cervical Brace: Soft collar;For comfort (per pt MD said to wean out of collar in 2 weeks)      Mobility  Bed Mobility Overal bed mobility: Needs Assistance Bed Mobility: Rolling;Sidelying to Sit Rolling: Supervision Sidelying to sit: Supervision       General bed mobility comments: pt using arms to pull on rail and push up to sit; educated on how to minimize vs sleep in his recliner  Transfers Overall transfer level: Independent Equipment used: None                Ambulation/Gait             General Gait Details: deferred; pt walking on unit independently  Stairs            Wheelchair Mobility    Modified Rankin (Stroke Patients Only)       Balance Overall balance assessment: No apparent balance deficits (not formally assessed)                                           Pertinent Vitals/Pain Pain Assessment: 0-10 Pain Score: 7  Pain Location: neck; Lt arm Pain Descriptors / Indicators: Aching Pain Intervention(s): Limited activity within patient's tolerance;Monitored during session;Repositioned    Home Living Family/patient expects to be discharged to:: Private  residence Living Arrangements: Spouse/significant other;Children Available Help at Discharge: Family;Available 24 hours/day Type of Home: House Home Access: Stairs to enter Entrance Stairs-Rails: None (has shelf beside) Entrance Stairs-Number of Steps: 3   Home Equipment: None      Prior Function Level of Independence: Independent         Comments: teaches (classroom and driver's ed)     Hand Dominance        Extremity/Trunk Assessment   Upper Extremity Assessment: Defer to OT evaluation           Lower Extremity Assessment: Overall WFL for tasks assessed      Cervical / Trunk Assessment: Other exceptions  Communication   Communication: No difficulties  Cognition Arousal/Alertness: Awake/alert Behavior During Therapy: WFL for tasks assessed/performed Overall Cognitive Status: Within Functional Limits for tasks assessed                      General Comments General comments (skin integrity, edema, etc.): Educated pt on information on neck precautions handout. 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 Educated on use of pain scale and try to stay <7/10. If gets above 7, need to use pain meds, rest, or ice.    Exercises        Assessment/Plan    PT Assessment Patent does not need any further PT  services  PT Diagnosis Acute pain   PT Problem List    PT Treatment Interventions     PT Goals (Current goals can be found in the Care Plan section) Acute Rehab PT Goals PT Goal Formulation: All assessment and education complete, DC therapy    Frequency     Barriers to discharge        Co-evaluation               End of Session Equipment Utilized During Treatment: Cervical collar Activity Tolerance: Patient tolerated treatment well Patient left: in chair;with call bell/phone within reach Nurse Communication: Patient requests pain meds    Functional Assessment Tool Used: clinical judgement Functional Limitation: Mobility: Walking  and moving around Mobility: Walking and Moving Around Current Status (K2706): At least 1 percent but less than 20 percent impaired, limited or restricted Mobility: Walking and Moving Around Goal Status [phone]): At least 1 percent but less than 20 percent impaired, limited or restricted Mobility: Walking and Moving Around Discharge Status [phone]): At least 1 percent but less than 20 percent impaired, limited or restricted    Time: 0837-0856 PT Time Calculation (min) (ACUTE ONLY): 19 min   Charges:   PT Evaluation $Initial PT Evaluation Tier I: 1 Procedure     PT G Codes:   PT G-Codes **NOT FOR INPATIENT CLASS** Functional Assessment Tool Used: clinical judgement Functional Limitation: Mobility: Walking and moving around Mobility: Walking and Moving Around Current Status (V6160): At least 1 percent but less than 20 percent impaired, limited or restricted Mobility: Walking and Moving Around Goal Status [phone]): At least 1 percent but less than 20 percent impaired, limited or restricted Mobility: Walking and Moving Around Discharge Status [phone]): At least 1 percent but less than 20 percent impaired, limited or restricted    Taren Toops 12/17/2014, 9:11 AM Pager [phone]

## 2014-12-17 NOTE — Progress Notes (Signed)
OT Cancellation Note  Patient Details Name: Charles Melendez MRN: [id number] DOB: June 03, 1966   Cancelled Treatment:    Reason Eval/Treat Not Completed: OT screened, no needs identified, will sign off. Spoke with pt and he has no OT concerns. Will sign off.  Benito Mccreedy OTR/L [phone] 12/17/2014, 9:38 AM

## 2014-12-17 NOTE — Progress Notes (Signed)
UR completed 

## 2016-05-09 IMAGING — MR MR CERVICAL SPINE W/O CM
4 of 5 series · 24 of 48 positions shown · non-contrast
Comparison: None.

CLINICAL DATA: 48-year-old male with neck and left shoulder
tightness. Left arm pain. Numbness and tingling left arm and hand.
Initial encounter.

EXAM:
MRI CERVICAL SPINE WITHOUT CONTRAST
TECHNIQUE: Multiplanar, multisequence MR imaging of the cervical spine was
performed. No intravenous contrast was administered.

[Series 3: T2 · sagittal · 3.3mm · 0.43mm/px · 6 of 12 slices shown (1 of 2)]
[im 1/12]
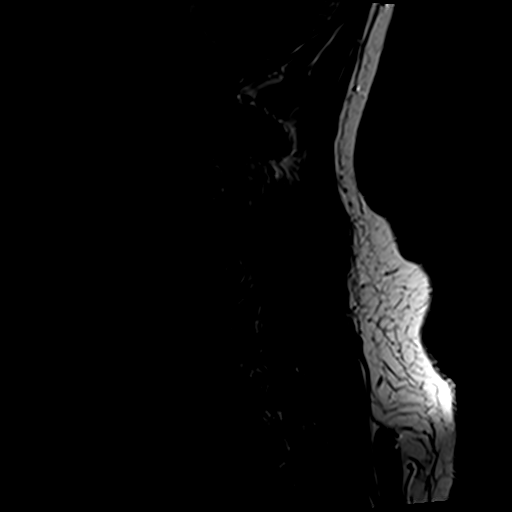
[im 3/12]
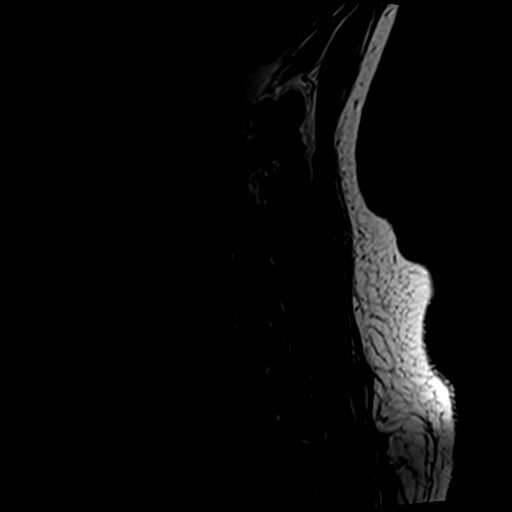
[im 5/12]
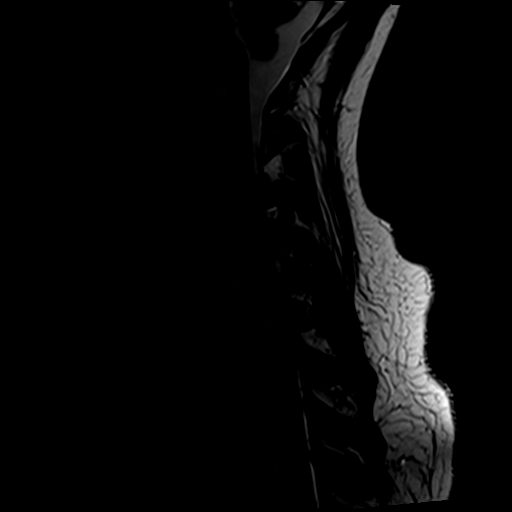
[im 7/12]
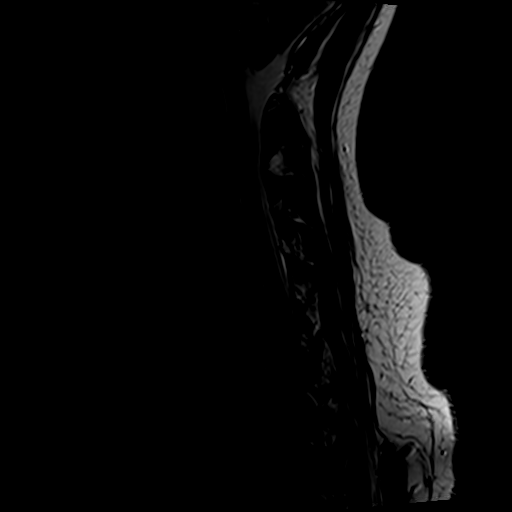
[im 9/12]
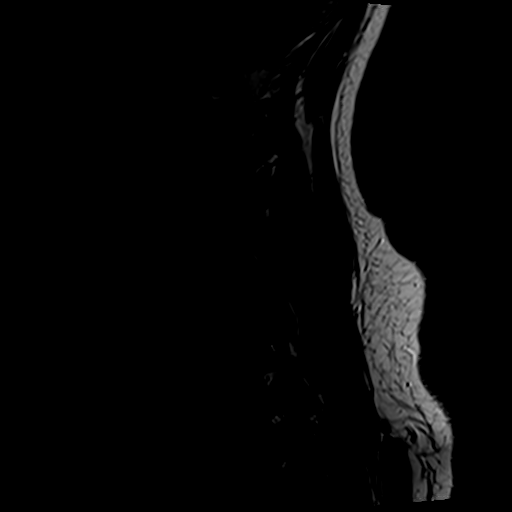
[im 12/12]
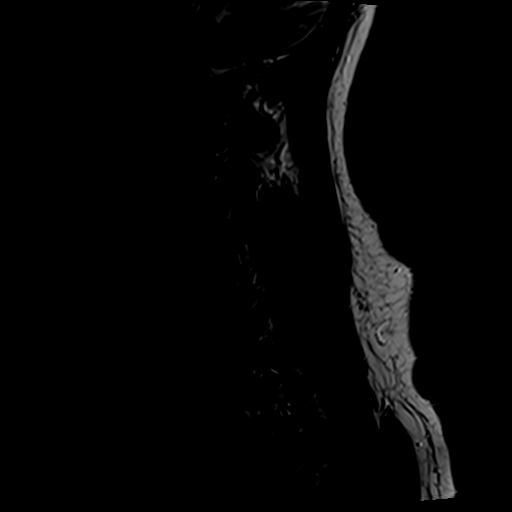

[Series 4: T1 · sagittal · 3.3mm · 0.43mm/px · 6 of 12 slices shown]
[im 1/12]
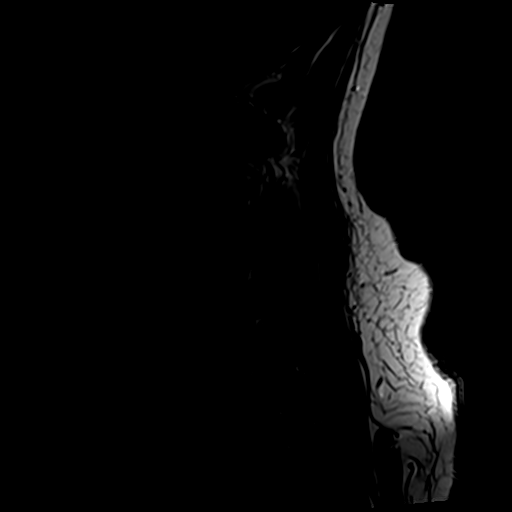
[im 3/12]
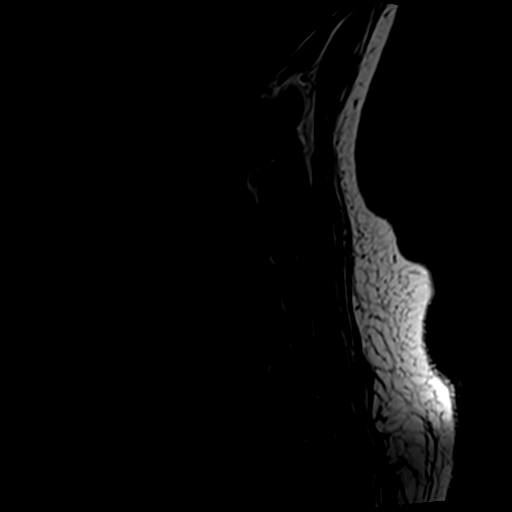
[im 5/12]
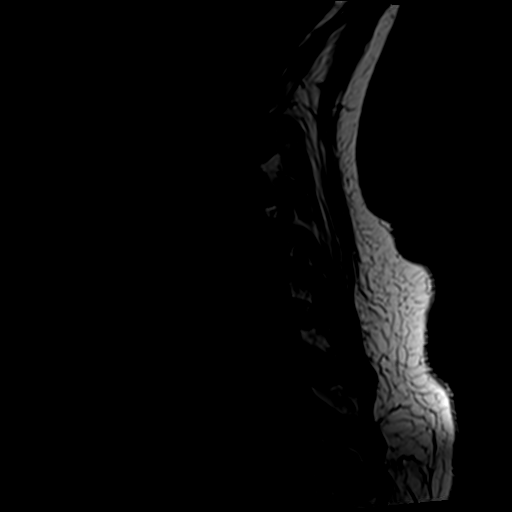
[im 7/12]
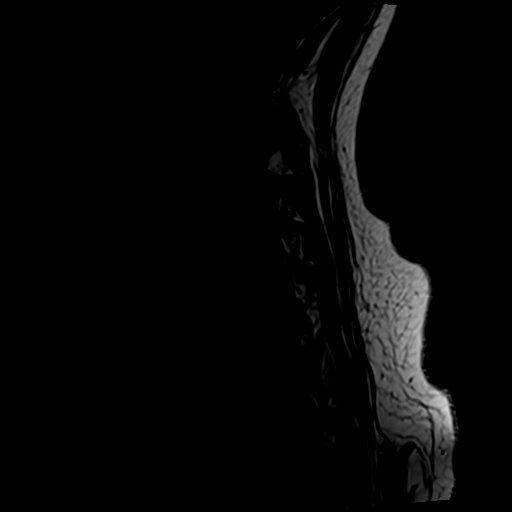
[im 9/12]
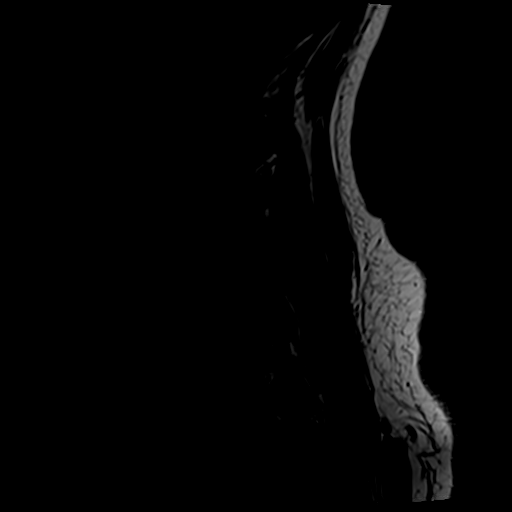
[im 12/12]
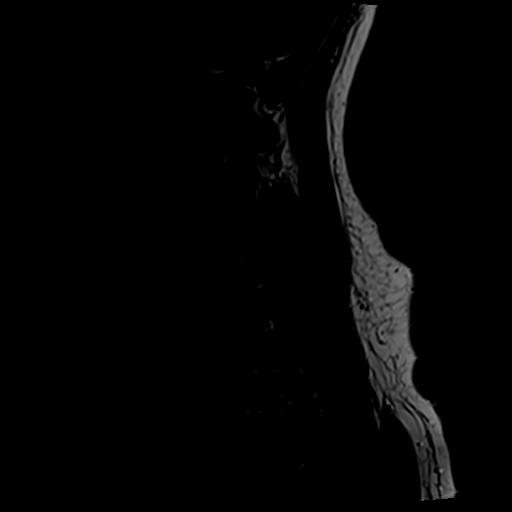

[Series 5: tir sag · sagittal · 3.3mm · 0.43mm/px · 3 of 12 slices shown]
[im 3/12]
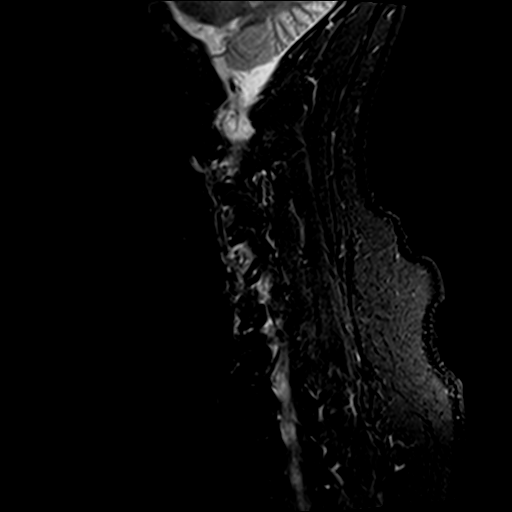
[im 7/12]
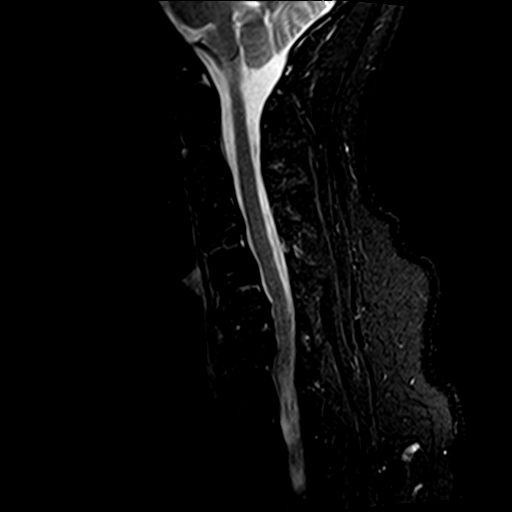
[im 12/12]
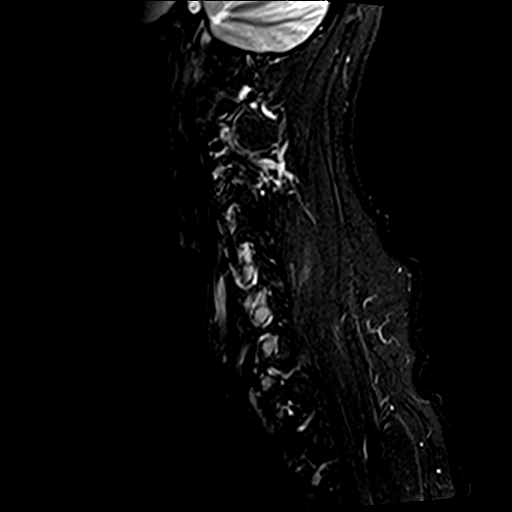

[Series 7: T2 · axial · 3.0mm · 0.70mm/px · z∈[-97,+1]mm · 9 of 28 slices shown (2 of 2)]
[im 1/28]
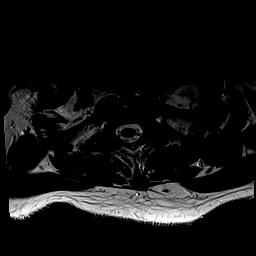
[im 4/28]
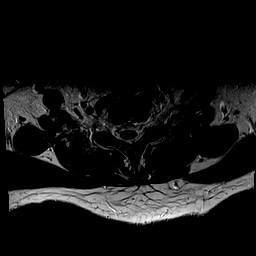
[im 8/28]
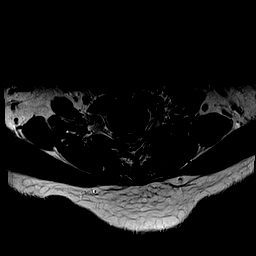
[im 12/28]
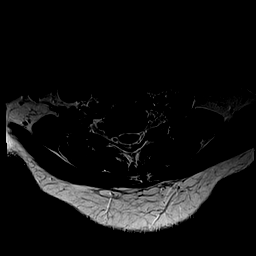
[im 14/28]
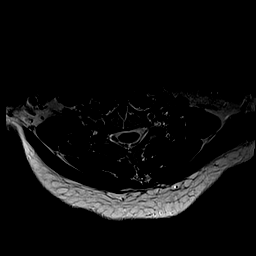
[im 16/28]
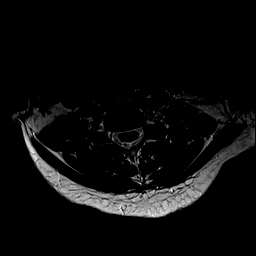
[im 20/28]
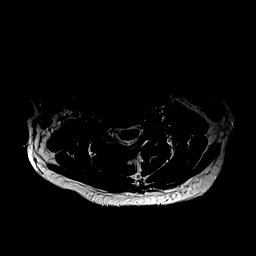
[im 24/28]
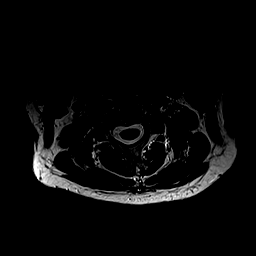
[im 28/28]
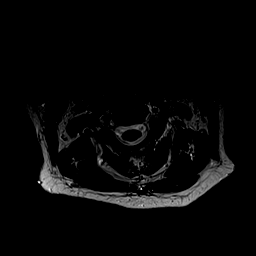

[24 of 48 positions shown; findings below may reference images not displayed]

FINDINGS: Cervical medullary junction and visualized intracranial structures
unremarkable. No focal cervical cord signal abnormality.

Mild kyphosis of the cervical spine centered at the C5-6 level.

Visualized paravertebral structures are unremarkable.

C2-3: Minimal spur left posterior lateral position with minimal
narrowing origin of the left neural foramen.

C3-4: Minimal bulge. Minimal narrowing ventral aspect of the thecal
sac. Minimal uncinate hypertrophy. Mild left facet joint
degenerative changes. Mild to moderate left foraminal narrowing.
Minimal right foraminal narrowing.

C4-5:  Minimal bulge.

C5-6: Disc degeneration with anterior osteophyte. Broad-based disc
osteophyte complex posteriorly with narrowing of the ventral aspect
of the thecal sac with minimal cord contact without significant
compression. Uncinate hypertrophy greater on the right with moderate
to marked right-sided and minimal left-sided foraminal narrowing.

C6-7: Large focal left posterior lateral extruded disc with mass
effect upon the left lateral aspect of the cord and left nerve roots
with narrowing of the origin of the left neural foramen. This
elevates the posterior longitudinal ligament. Minimal right
foraminal narrowing.

C7-T1: Mild facet joint degenerative changes. No significant spinal
stenosis or foraminal narrowing.

T1-2:  Negative.

T2-3: Bulge with mild narrowing ventral aspect of the thecal sac
approaching the cord. Axial images not obtained.
IMPRESSION: In this patient with left-sided symptoms, most notable left-sided
findings are at the C6-7 level followed by the C3-4 level. Most
notable right-sided findings at the C5-6 level. Summary of pertinent
findings includes:

C6-7 large focal left posterior lateral extruded disc with mass
effect upon the left lateral aspect of the cord and left nerve roots
with narrowing of the origin of the left neural foramen.

C3-4 minimal bulge. Minimal narrowing ventral aspect of the thecal
sac. Minimal uncinate hypertrophy. Mild left facet joint
degenerative changes. Mild to moderate left foraminal narrowing.
Minimal right foraminal narrowing.

C2-3 minimal narrowing origin of the left neural foramen.

C5-6 broad-based disc osteophyte complex with narrowing of the
ventral aspect of the thecal sac with minimal cord contact without
significant compression. Uncinate hypertrophy greater on the right
with moderate to marked right-sided and minimal left-sided foraminal
narrowing.

T2-3 bulge.

These results were called by telephone at the time of interpretation
on 12/02/2014 at [DATE] to Dr. HIN KEUNG NABUA , who verbally
acknowledged these results.

## 2017-07-05 ENCOUNTER — Encounter: Payer: Self-pay | Admitting: Gastroenterology

## 2017-08-03 ENCOUNTER — Ambulatory Visit (AMBULATORY_SURGERY_CENTER): Payer: Self-pay

## 2017-08-03 VITALS — Ht 74.0 in | Wt 190.8 lb

## 2017-08-03 DIAGNOSIS — Z1211 Encounter for screening for malignant neoplasm of colon: Secondary | ICD-10-CM

## 2017-08-03 MED ORDER — PEG 3350-KCL-NA BICARB-NACL 420 G PO SOLR: 4000.0000 mL | Freq: Once | ORAL | 0 refills | Status: AC

## 2017-08-03 NOTE — Progress Notes (Signed)
Per pt, no allergies to soy or egg products.Pt not taking any weight loss meds or using  O2 at home.  Pt refused emmi video. 

## 2017-08-11 ENCOUNTER — Ambulatory Visit (AMBULATORY_SURGERY_CENTER): Payer: BC Managed Care – PPO | Admitting: Gastroenterology

## 2017-08-11 ENCOUNTER — Encounter: Payer: Self-pay | Admitting: Gastroenterology

## 2017-08-11 VITALS — BP 98/64 | HR 60 | Temp 96.9°F | Resp 18 | Ht 74.0 in | Wt 190.0 lb

## 2017-08-11 DIAGNOSIS — D120 Benign neoplasm of cecum: Secondary | ICD-10-CM

## 2017-08-11 DIAGNOSIS — K639 Disease of intestine, unspecified: Secondary | ICD-10-CM

## 2017-08-11 DIAGNOSIS — Z1211 Encounter for screening for malignant neoplasm of colon: Secondary | ICD-10-CM

## 2017-08-11 MED ORDER — SODIUM CHLORIDE 0.9 % IV SOLN: 500.0000 mL | INTRAVENOUS | Status: DC

## 2017-08-11 NOTE — Op Note (Signed)
[place] Patient Name: Charles Melendez Procedure Date: 08/11/2017 1:55 PM MRN: [id number] Endoscopist: Milus Banister , MD Age: 51 Referring MD:  Date of Birth: 11-02-1965 Gender: Male Account #: [id number] Procedure:                Colonoscopy Indications:              Screening for colorectal malignant neoplasm Medicines:                Monitored Anesthesia Care Procedure:                Pre-Anesthesia Assessment:                           - Prior to the procedure, a History and Physical                            was performed, and patient medications and                            allergies were reviewed. The patient's tolerance of                            previous anesthesia was also reviewed. The risks                            and benefits of the procedure and the sedation                            options and risks were discussed with the patient.                            All questions were answered, and informed consent                            was obtained. Prior Anticoagulants: The patient has                            taken no previous anticoagulant or antiplatelet                            agents. ASA Grade Assessment: II - A patient with                            mild systemic disease. After reviewing the risks                            and benefits, the patient was deemed in                            satisfactory condition to undergo the procedure.                           After obtaining informed consent, the colonoscope  was passed under direct vision. Throughout the                            procedure, the patient's blood pressure, pulse, and                            oxygen saturations were monitored continuously. The                            Colonoscope was introduced through the anus and                            advanced to the the cecum, identified by                            appendiceal orifice and  ileocecal valve. The                            colonoscopy was performed without difficulty. The                            patient tolerated the procedure well. The quality                            of the bowel preparation was good. The ileocecal                            valve, appendiceal orifice, and rectum were                            photographed. Scope In: 1:58:03 PM Scope Out: 2:10:45 PM Scope Withdrawal Time: 0 hours 8 minutes 27 seconds  Total Procedure Duration: 0 hours 12 minutes 42 seconds  Findings:                 A 2 mm polyp was found in the cecum. The polyp was                            sessile. The polyp was removed with a cold snare.                            Resection and retrieval were complete.                           A localized area of mildly erythematous mucosa was                            found in the sigmoid colon along two folds. Likely                            'prep effect'. Biopsies were taken with a cold                            forceps for histology.  The exam was otherwise without abnormality on                            direct and retroflexion views. Complications:            No immediate complications. Estimated blood loss:                            None. Estimated Blood Loss:     Estimated blood loss: none. Impression:               - One 2 mm polyp in the cecum, removed with a cold                            snare. Resected and retrieved.                           - Focal area of erythematous mucosa in the sigmoid                            colon that is likely mild irritation related to the                            prep. Biopsied.                           - The examination was otherwise normal on direct                            and retroflexion views. Recommendation:           - Patient has a contact number available for                            emergencies. The signs and symptoms of potential                             delayed complications were discussed with the                            patient. Return to normal activities tomorrow.                            Written discharge instructions were provided to the                            patient.                           - Resume previous diet.                           - Continue present medications.                           You will receive a letter within 2-3 weeks with the  pathology results and my final recommendations.                           If the polyp(s) is proven to be 'pre-cancerous' on                            pathology, you will need repeat colonoscopy in 5                            years. If the polyp(s) is NOT 'precancerous' on                            pathology then you should repeat colon cancer                            screening in 10 years with colonoscopy without need                            for colon cancer screening by any method prior to                            then (including stool testing). Milus Banister, MD 08/11/2017 2:14:00 PM This report has been signed electronically.

## 2017-08-11 NOTE — Patient Instructions (Signed)
Discharge instructions given. Handout on polyps. Resume previous medications. YOU HAD AN ENDOSCOPIC PROCEDURE TODAY AT THE [facility]:   Refer to the procedure report that was given to you for any specific questions about what was found during the examination.  If the procedure report does not answer your questions, please call your gastroenterologist to clarify.  If you requested that your care partner not be given the details of your procedure findings, then the procedure report has been included in a sealed envelope for you to review at your convenience later.  YOU SHOULD EXPECT: Some feelings of bloating in the abdomen. Passage of more gas than usual.  Walking can help get rid of the air that was put into your GI tract during the procedure and reduce the bloating. If you had a lower endoscopy (such as a colonoscopy or flexible sigmoidoscopy) you may notice spotting of blood in your stool or on the toilet paper. If you underwent a bowel prep for your procedure, you may not have a normal bowel movement for a few days.  Please Note:  You might notice some irritation and congestion in your nose or some drainage.  This is from the oxygen used during your procedure.  There is no need for concern and it should clear up in a day or so.  SYMPTOMS TO REPORT IMMEDIATELY:   Following lower endoscopy (colonoscopy or flexible sigmoidoscopy):  Excessive amounts of blood in the stool  Significant tenderness or worsening of abdominal pains  Swelling of the abdomen that is new, acute  Fever of 100F or higher   For urgent or emergent issues, a gastroenterologist can be reached at any hour by calling [phone].   DIET:  We do recommend a small meal at first, but then you may proceed to your regular diet.  Drink plenty of fluids but you should avoid alcoholic beverages for 24 hours.  ACTIVITY:  You should plan to take it easy for the rest of today and you should NOT DRIVE or use heavy  machinery until tomorrow (because of the sedation medicines used during the test).    FOLLOW UP: Our staff will call the number listed on your records the next business day following your procedure to check on you and address any questions or concerns that you may have regarding the information given to you following your procedure. If we do not reach you, we will leave a message.  However, if you are feeling well and you are not experiencing any problems, there is no need to return our call.  We will assume that you have returned to your regular daily activities without incident.  If any biopsies were taken you will be contacted by phone or by letter within the next 1-3 weeks.  Please call us at [phone] if you have not heard about the biopsies in 3 weeks.    SIGNATURES/CONFIDENTIALITY: You and/or your care partner have signed paperwork which will be entered into your electronic medical record.  These signatures attest to the fact that that the information above on your After Visit Summary has been reviewed and is understood.  Full responsibility of the confidentiality of this discharge information lies with you and/or your care-partner. 

## 2017-08-11 NOTE — Progress Notes (Signed)
To Pacu, VSS. Report to [email or web address] 

## 2017-08-11 NOTE — Progress Notes (Signed)
Called to room to assist during endoscopic procedure.  Patient ID and intended procedure confirmed with present staff. Received instructions for my participation in the procedure from the performing physician.  

## 2017-08-14 ENCOUNTER — Telehealth: Payer: Self-pay

## 2017-08-14 NOTE — Telephone Encounter (Signed)
Left message on answering machine. 

## 2017-08-16 ENCOUNTER — Telehealth: Payer: Self-pay | Admitting: *Deleted

## 2017-08-16 NOTE — Telephone Encounter (Signed)
Message left

## 2019-03-18 ENCOUNTER — Ambulatory Visit (INDEPENDENT_AMBULATORY_CARE_PROVIDER_SITE_OTHER): Payer: BC Managed Care – PPO | Admitting: Physician Assistant

## 2019-03-18 ENCOUNTER — Encounter: Payer: Self-pay | Admitting: Physician Assistant

## 2019-03-18 ENCOUNTER — Other Ambulatory Visit: Payer: Self-pay

## 2019-03-18 VITALS — BP 118/80 | HR 66 | Temp 98.3°F | Resp 14 | Ht 74.0 in | Wt 200.0 lb

## 2019-03-18 DIAGNOSIS — Z0000 Encounter for general adult medical examination without abnormal findings: Secondary | ICD-10-CM | POA: Diagnosis not present

## 2019-03-18 DIAGNOSIS — Z125 Encounter for screening for malignant neoplasm of prostate: Secondary | ICD-10-CM

## 2019-03-18 DIAGNOSIS — D171 Benign lipomatous neoplasm of skin and subcutaneous tissue of trunk: Secondary | ICD-10-CM | POA: Diagnosis not present

## 2019-03-18 NOTE — Patient Instructions (Signed)
Please go to the lab for blood work.   Our office will call you with your results unless you have chosen to receive results via MyChart.  If your blood work is normal we will follow-up each year for physicals and as scheduled for chronic medical problems.  If anything is abnormal we will treat accordingly and get you in for a follow-up.  It was very nice meeting you today. Welcome to [facility]!   Preventive Care 51-53 Years Old, Male Preventive care refers to lifestyle choices and visits with your health care provider that can promote health and wellness. This includes:  A yearly physical exam. This is also called an annual well check.  Regular dental and eye exams.  Immunizations.  Screening for certain conditions.  Healthy lifestyle choices, such as eating a healthy diet, getting regular exercise, not using drugs or products that contain nicotine and tobacco, and limiting alcohol use. What can I expect for my preventive care visit? Physical exam Your health care provider will check:  Height and weight. These may be used to calculate body mass index (BMI), which is a measurement that tells if you are at a healthy weight.  Heart rate and blood pressure.  Your skin for abnormal spots. Counseling Your health care provider may ask you questions about:  Alcohol, tobacco, and drug use.  Emotional well-being.  Home and relationship well-being.  Sexual activity.  Eating habits.  Work and work Statistician. What immunizations do I need?  Influenza (flu) vaccine  This is recommended every year. Tetanus, diphtheria, and pertussis (Tdap) vaccine  You may need a Td booster every 10 years. Varicella (chickenpox) vaccine  You may need this vaccine if you have not already been vaccinated. Zoster (shingles) vaccine  You may need this after age 20. Measles, mumps, and rubella (MMR) vaccine  You may need at least one dose of MMR if you were born in 1957 or later. You may  also need a second dose. Pneumococcal conjugate (PCV13) vaccine  You may need this if you have certain conditions and were not previously vaccinated. Pneumococcal polysaccharide (PPSV23) vaccine  You may need one or two doses if you smoke cigarettes or if you have certain conditions. Meningococcal conjugate (MenACWY) vaccine  You may need this if you have certain conditions. Hepatitis A vaccine  You may need this if you have certain conditions or if you travel or work in places where you may be exposed to hepatitis A. Hepatitis B vaccine  You may need this if you have certain conditions or if you travel or work in places where you may be exposed to hepatitis B. Haemophilus influenzae type b (Hib) vaccine  You may need this if you have certain risk factors. Human papillomavirus (HPV) vaccine  If recommended by your health care provider, you may need three doses over 6 months. You may receive vaccines as individual doses or as more than one vaccine together in one shot (combination vaccines). Talk with your health care provider about the risks and benefits of combination vaccines. What tests do I need? Blood tests  Lipid and cholesterol levels. These may be checked every 5 years, or more frequently if you are over 57 years old.  Hepatitis C test.  Hepatitis B test. Screening  Lung cancer screening. You may have this screening every year starting at age 84 if you have a 30-pack-year history of smoking and currently smoke or have quit within the past 15 years.  Prostate cancer screening. Recommendations will vary  depending on your family history and other risks.  Colorectal cancer screening. All adults should have this screening starting at age 6 and continuing until age 39. Your health care provider may recommend screening at age 105 if you are at increased risk. You will have tests every 1-10 years, depending on your results and the type of screening test.  Diabetes screening.  This is done by checking your blood sugar (glucose) after you have not eaten for a while (fasting). You may have this done every 1-3 years.  Sexually transmitted disease (STD) testing. Follow these instructions at home: Eating and drinking  Eat a diet that includes fresh fruits and vegetables, whole grains, lean protein, and low-fat dairy products.  Take vitamin and mineral supplements as recommended by your health care provider.  Do not drink alcohol if your health care provider tells you not to drink.  If you drink alcohol: ? Limit how much you have to 0-2 drinks a day. ? Be aware of how much alcohol is in your drink. In the [place] one drink equals one 12 oz bottle of beer (355 mL), one 5 oz glass of wine (148 mL), or one 1 oz glass of hard liquor (44 mL). Lifestyle  Take daily care of your teeth and gums.  Stay active. Exercise for at least 30 minutes on 5 or more days each week.  Do not use any products that contain nicotine or tobacco, such as cigarettes, e-cigarettes, and chewing tobacco. If you need help quitting, ask your health care provider.  If you are sexually active, practice safe sex. Use a condom or other form of protection to prevent STIs (sexually transmitted infections).  Talk with your health care provider about taking a low-dose aspirin every day starting at age 26. What's next?  Go to your health care provider once a year for a well check visit.  Ask your health care provider how often you should have your eyes and teeth checked.  Stay up to date on all vaccines. This information is not intended to replace advice given to you by your health care provider. Make sure you discuss any questions you have with your health care provider. Document Released: 08/28/2015 Document Revised: 07/26/2018 Document Reviewed: 07/26/2018 Elsevier Patient Education  2020 [employer]. .

## 2019-03-18 NOTE — Progress Notes (Signed)
Patient presents to clinic today to establish care. Is requesting CPE today if time allows. Is fasting for labs.   Diet -- Endorses well-balanced diet overall. Trying to watch fatty foods.  Exercise -- Walking 3 miles 6 x week.   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 Does not feel that it has changed. Asymptomatic. Would like second opinion to make sure. .   Chronic Issues: Hyperlipidemia -- Notes history. Never on Rx medication. Is taking Fish oil supplement daily and has kept active as noted above.   Health Maintenance: Immunizations -- Tetanus in 2012 per patient. Colonoscopy -- UTD  Past Medical History:  Diagnosis Date  . Arthritis   . Pneumonia     Past Surgical History:  Procedure Laterality Date  . APPENDECTOMY  2009  . DENTAL SURGERY  1991   6 teeth pulled/   . MOUTH SURGERY  1986   gum graft  . POSTERIOR CERVICAL FUSION/FORAMINOTOMY N/A 12/16/2014   Procedure: LEFT C6-7 POSTERIOR CERVICAL FORAMINOTOMY WITH EXCISION OF HERNIATED [place];  Surgeon: Jessy Oto, MD;  Location: [facility];  Service: Orthopedics;  Laterality: N/A;    Current Outpatient Medications on File Prior to Visit  Medication Sig Dispense Refill  . Coenzyme Q10 (COQ10 PO) Take 1 tablet by mouth daily.    Marland Kitchen glucosamine-chondroitin 500-400 MG tablet Take 2 tablets by mouth daily.     . Multiple Vitamins-Minerals (MULTIVITAMIN WITH MINERALS) tablet Take 1 tablet by mouth daily.    . Omega-3 Fatty Acids (FISH OIL) 1000 MG CAPS Take 2,000 mg by mouth daily.     . Plant Sterols and Stanols (CHOLEST OFF PO) Take 2 Doses by mouth daily.      No current facility-administered medications on file prior to visit.     No Known Allergies  Family History  Problem Relation Age of Onset  . Celiac disease Mother   . Thyroid disease Father   . Diabetes Father   . Kidney disease Father   . Renal Disease Father   . Heart disease Father   . Heart disease  Maternal Grandmother   . Heart disease Maternal Grandfather   . Heart disease Paternal Grandmother   . Heart disease Paternal Grandfather     Social History   Socioeconomic History  . Marital status: Married    Spouse name: Not on file  . Number of children: Not on file  . Years of education: Not on file  . Highest education level: Not on file  Occupational History  . Not on file  Social Needs  . Financial resource strain: Not on file  . Food insecurity    Worry: Not on file    Inability: Not on file  . Transportation needs    Medical: Not on file    Non-medical: Not on file  Tobacco Use  . Smoking status: Never Smoker  . Smokeless tobacco: Never Used  Substance and Sexual Activity  . Alcohol use: Yes    Comment: occasional - couple of glasses of wine  . Drug use: No  . Sexual activity: Yes    Partners: Female  Lifestyle  . Physical activity    Days per week: Not on file    Minutes per session: Not on file  . Stress: Not on file  Relationships  . Social Herbalist on phone: Not on file    Gets together: Not on file    Attends  religious service: Not on file    Active member of club or organization: Not on file    Attends meetings of clubs or organizations: Not on file    Relationship status: Not on file  . Intimate partner violence    Fear of current or ex partner: Not on file    Emotionally abused: Not on file    Physically abused: Not on file    Forced sexual activity: Not on file  Other Topics Concern  . Not on file  Social History Narrative  . Not on file    Review of Systems  Constitutional: Negative for fever and weight loss.  HENT: Negative for ear discharge, ear pain, hearing loss and tinnitus.   Eyes: Negative for blurred vision, double vision, photophobia and pain.  Respiratory: Negative for cough and shortness of breath.   Cardiovascular: Negative for chest pain and palpitations.  Gastrointestinal: Negative for abdominal pain, blood  in stool, constipation, diarrhea, heartburn, melena, nausea and vomiting.  Genitourinary: Negative for dysuria, flank pain, frequency, hematuria and urgency.       Nocturia x 0-1.   Musculoskeletal: Positive for neck pain (+ history -- followed by specialist). Negative for falls.  Neurological: Negative for dizziness, loss of consciousness and headaches.  Endo/Heme/Allergies: Negative for environmental allergies.  Psychiatric/Behavioral: Negative for depression, hallucinations, substance abuse and suicidal ideas. The patient is not nervous/anxious and does not have insomnia.    BP 118/80   Pulse 66   Temp 98.3 F (36.8 C) (Skin)   Resp 14   Ht 6\' 2"  (1.88 m)   Wt 200 lb (90.7 kg)   SpO2 99%   BMI 25.68 kg/m   Physical Exam Vitals signs reviewed.  Constitutional:      General: He is not in acute distress.    Appearance: He is well-developed. He is not diaphoretic.  HENT:     Head: Normocephalic and atraumatic.     Right Ear: Tympanic membrane, ear canal and external ear normal.     Left Ear: Tympanic membrane, ear canal and external ear normal.     Nose: Nose normal.     Mouth/Throat:     Pharynx: No posterior oropharyngeal erythema.  Eyes:     Conjunctiva/sclera: Conjunctivae normal.     Pupils: Pupils are equal, round, and reactive to light.  Neck:     Musculoskeletal: Neck supple.     Thyroid: No thyromegaly.  Cardiovascular:     Rate and Rhythm: Normal rate and regular rhythm.     Heart sounds: Normal heart sounds.  Pulmonary:     Effort: Pulmonary effort is normal. No respiratory distress.     Breath sounds: Normal breath sounds. No wheezing or rales.  Chest:     Chest wall: No tenderness.  Abdominal:     General: Bowel sounds are normal. There is no distension.     Palpations: Abdomen is soft. There is no mass.     Tenderness: There is no abdominal tenderness. There is no guarding or rebound.  Lymphadenopathy:     Cervical: No cervical adenopathy.  Skin:     General: Skin is warm and dry.     Findings: No rash.  Neurological:     Mental Status: He is alert and oriented to person, place, and time.     Cranial Nerves: No cranial nerve deficit.    Assessment/Plan: 1. Visit for preventive health examination Depression screen negative. Health Maintenance reviewed . Preventive schedule discussed and handout given in  AVS. Will obtain fasting labs today.  - CBC with Differential/Platelet - Comprehensive metabolic panel - Hemoglobin A1c - Lipid panel  2. Prostate cancer screening The natural history of prostate cancer and ongoing controversy regarding screening and potential treatment outcomes of prostate cancer has been discussed with the patient. The meaning of a false positive PSA and a false negative PSA has been discussed. He indicates understanding of the limitations of this screening test and wishes to proceed with screening PSA testing.  - PSA  3. Lipoma of abdominal wall Discussed benign etiology of lipomas. Patient elects to monitor for now.    Leeanne Rio, PA-C

## 2019-03-19 LAB — CBC WITH DIFFERENTIAL/PLATELET
Basophils Absolute: 0.1 10*3/uL (ref 0.0–0.1)
Basophils Relative: 1.1 % (ref 0.0–3.0)
Eosinophils Absolute: 0.1 10*3/uL (ref 0.0–0.7)
Eosinophils Relative: 1.4 % (ref 0.0–5.0)
HCT: 42.1 % (ref 39.0–52.0)
Hemoglobin: 14.2 g/dL (ref 13.0–17.0)
Lymphocytes Relative: 39 % (ref 12.0–46.0)
Lymphs Abs: 2 10*3/uL (ref 0.7–4.0)
MCHC: 33.8 g/dL (ref 30.0–36.0)
MCV: 92.8 fl (ref 78.0–100.0)
Monocytes Absolute: 0.3 10*3/uL (ref 0.1–1.0)
Monocytes Relative: 6.4 % (ref 3.0–12.0)
Neutro Abs: 2.7 10*3/uL (ref 1.4–7.7)
Neutrophils Relative %: 52.1 % (ref 43.0–77.0)
Platelets: 190 10*3/uL (ref 150.0–400.0)
RBC: 4.54 Mil/uL (ref 4.22–5.81)
RDW: 13.7 % (ref 11.5–15.5)
WBC: 5.2 10*3/uL (ref 4.0–10.5)

## 2019-03-19 LAB — COMPREHENSIVE METABOLIC PANEL
ALT: 28 U/L (ref 0–53)
AST: 22 U/L (ref 0–37)
Albumin: 4.7 g/dL (ref 3.5–5.2)
Alkaline Phosphatase: 36 U/L — ABNORMAL LOW (ref 39–117)
BUN: 19 mg/dL (ref 6–23)
CO2: 29 mEq/L (ref 19–32)
Calcium: 9.5 mg/dL (ref 8.4–10.5)
Chloride: 102 mEq/L (ref 96–112)
Creatinine, Ser: 0.86 mg/dL (ref 0.40–1.50)
GFR: 92.95 mL/min (ref >60.00)
Glucose, Bld: 90 mg/dL (ref 70–99)
Potassium: 3.8 mEq/L (ref 3.5–5.1)
Sodium: 139 mEq/L (ref 135–145)
Total Bilirubin: 0.8 mg/dL (ref 0.2–1.2)
Total Protein: 7 g/dL (ref 6.0–8.3)

## 2019-03-19 LAB — LIPID PANEL
Cholesterol: 229 mg/dL — ABNORMAL HIGH (ref 0–200)
HDL: 52 mg/dL (ref >39.00)
LDL Cholesterol: 154 mg/dL — ABNORMAL HIGH (ref 0–99)
NonHDL: 176.94
Total CHOL/HDL Ratio: 4
Triglycerides: 113 mg/dL (ref 0.0–149.0)
VLDL: 22.6 mg/dL (ref 0.0–40.0)

## 2019-03-19 LAB — HEMOGLOBIN A1C: Hgb A1c MFr Bld: 5.6 % (ref 4.6–6.5)

## 2019-03-19 LAB — PSA: PSA: 0.34 ng/mL (ref 0.10–4.00)

## 2019-08-30 ENCOUNTER — Other Ambulatory Visit: Payer: Self-pay

## 2019-08-30 ENCOUNTER — Ambulatory Visit: Payer: BC Managed Care – PPO | Attending: Internal Medicine

## 2019-08-30 DIAGNOSIS — Z20822 Contact with and (suspected) exposure to covid-19: Secondary | ICD-10-CM

## 2019-08-31 LAB — NOVEL CORONAVIRUS, NAA: SARS-CoV-2, NAA: NOT DETECTED

## 2020-12-02 ENCOUNTER — Telehealth: Payer: Self-pay

## 2020-12-02 NOTE — Telephone Encounter (Signed)
LVM advising patient to call back and schedule TOC. 

## 2022-05-23 ENCOUNTER — Encounter: Payer: Self-pay | Admitting: Gastroenterology

## 2022-07-06 ENCOUNTER — Ambulatory Visit (AMBULATORY_SURGERY_CENTER): Payer: Self-pay | Admitting: *Deleted

## 2022-07-06 VITALS — Ht 74.0 in | Wt 206.0 lb

## 2022-07-06 DIAGNOSIS — Z86010 Personal history of colonic polyps: Secondary | ICD-10-CM

## 2022-07-06 MED ORDER — NA SULFATE-K SULFATE-MG SULF 17.5-3.13-1.6 GM/177ML PO SOLN: 1.0000 | Freq: Once | ORAL | 0 refills | Status: AC

## 2022-07-06 NOTE — Progress Notes (Signed)
In person Coupon given No egg or soy allergy known to patient  No issues known to pt with past sedation with any surgeries or procedures Patient denies ever being told they had issues or difficulty with intubation  No FH of Malignant Hyperthermia Pt is not on diet pills Pt is not on  home 02  Pt is not on blood thinners  Pt denies issues with constipation  Pt encouraged to use to use Singlecare or Goodrx to reduce cost  Patient's chart reviewed by Osvaldo Angst CNRA prior to previsit and patient appropriate for the [facility].  Previsit completed and red dot placed by patient's name on their procedure day (on provider's schedule).  Marland Kitchen

## 2022-08-09 ENCOUNTER — Encounter: Payer: Self-pay | Admitting: Gastroenterology

## 2022-08-10 ENCOUNTER — Telehealth: Payer: Self-pay | Admitting: Gastroenterology

## 2022-08-10 NOTE — Telephone Encounter (Signed)
Patient has a skin infection in his groin area. Would like to speak to a nurse to see if he can still proceed with his procedure on Friday 12/29.

## 2022-08-10 NOTE — Telephone Encounter (Signed)
Pt. Stated that he has a rash in groin area and it has not responded to anything that he had used over the counter and he was [facility] , he wanted to make sure that the rash would not prevent him from having procedure,made aware the rash will not prevent him from having procedure,also stated "it is not bothering me now but was seeking treatment that it may just be jock itch"

## 2022-08-12 ENCOUNTER — Encounter: Payer: Self-pay | Admitting: Gastroenterology

## 2022-08-12 ENCOUNTER — Ambulatory Visit (AMBULATORY_SURGERY_CENTER): Payer: BC Managed Care – PPO | Admitting: Gastroenterology

## 2022-08-12 VITALS — BP 101/67 | HR 67 | Temp 96.8°F | Resp 13 | Ht 74.0 in | Wt 206.0 lb

## 2022-08-12 DIAGNOSIS — Z09 Encounter for follow-up examination after completed treatment for conditions other than malignant neoplasm: Secondary | ICD-10-CM

## 2022-08-12 DIAGNOSIS — K635 Polyp of colon: Secondary | ICD-10-CM | POA: Diagnosis not present

## 2022-08-12 DIAGNOSIS — D124 Benign neoplasm of descending colon: Secondary | ICD-10-CM

## 2022-08-12 DIAGNOSIS — D122 Benign neoplasm of ascending colon: Secondary | ICD-10-CM

## 2022-08-12 DIAGNOSIS — Z86010 Personal history of colonic polyps: Secondary | ICD-10-CM

## 2022-08-12 DIAGNOSIS — D123 Benign neoplasm of transverse colon: Secondary | ICD-10-CM

## 2022-08-12 MED ORDER — SODIUM CHLORIDE 0.9 % IV SOLN: 500.0000 mL | INTRAVENOUS | Status: DC

## 2022-08-12 NOTE — Progress Notes (Signed)
GASTROENTEROLOGY PROCEDURE H&P NOTE   Primary Care Physician: Pcp, No  HPI: Charles Melendez is a 56 y.o. male who presents for Colonoscopy for surveillance in setting of previous SSPs.  Past Medical History:  Diagnosis Date   Allergy    Arthritis    Hyperlipidemia    Pneumonia    Past Surgical History:  Procedure Laterality Date   APPENDECTOMY  2009   DENTAL SURGERY  1991   6 teeth pulled/    MOUTH SURGERY  1986   gum graft   POSTERIOR CERVICAL FUSION/FORAMINOTOMY N/A 12/16/2014   Procedure: LEFT C6-7 POSTERIOR CERVICAL FORAMINOTOMY WITH EXCISION OF HERNIATED [place];  Surgeon: Jessy Oto, MD;  Location: [facility];  Service: Orthopedics;  Laterality: N/A;   Current Outpatient Medications  Medication Sig Dispense Refill   ASHWAGANDHA PO Take by mouth.     aspirin EC 81 MG tablet Take 81 mg by mouth daily. Swallow whole.     Coenzyme Q10 (COQ10 PO) Take 1 tablet by mouth daily.     GARLIC PO Take by mouth.     glucosamine-chondroitin 500-400 MG tablet Take 2 tablets by mouth daily.      LECITHIN PO Take by mouth.     Multiple Vitamins-Minerals (MULTIVITAMIN WITH MINERALS) tablet Take 1 tablet by mouth daily.     Omega-3 Fatty Acids (FISH OIL) 1000 MG CAPS Take 2,000 mg by mouth daily.      Plant Sterols and Stanols (CHOLEST OFF PO) Take 2 Doses by mouth daily.      Current Facility-Administered Medications  Medication Dose Route Frequency Provider Last Rate Last Admin   0.9 %  sodium chloride infusion  500 mL Intravenous Continuous Mansouraty, Telford Nab., MD        Current Outpatient Medications:    ASHWAGANDHA PO, Take by mouth., Disp: , Rfl:    aspirin EC 81 MG tablet, Take 81 mg by mouth daily. Swallow whole., Disp: , Rfl:    Coenzyme Q10 (COQ10 PO), Take 1 tablet by mouth daily., Disp: , Rfl:    GARLIC PO, Take by mouth., Disp: , Rfl:    glucosamine-chondroitin 500-400 MG tablet, Take 2 tablets by mouth daily. , Disp: , Rfl:    LECITHIN PO, Take by mouth., Disp: ,  Rfl:    Multiple Vitamins-Minerals (MULTIVITAMIN WITH MINERALS) tablet, Take 1 tablet by mouth daily., Disp: , Rfl:    Omega-3 Fatty Acids (FISH OIL) 1000 MG CAPS, Take 2,000 mg by mouth daily. , Disp: , Rfl:    Plant Sterols and Stanols (CHOLEST OFF PO), Take 2 Doses by mouth daily. , Disp: , Rfl:   Current Facility-Administered Medications:    0.9 %  sodium chloride infusion, 500 mL, Intravenous, Continuous, Mansouraty, Telford Nab., MD Allergies  Allergen Reactions   Azithromycin Other (See Comments)    Unknown reaction   Family History  Problem Relation Age of Onset   Celiac disease Mother    Thyroid disease Father    Diabetes Father    Kidney disease Father    Renal Disease Father    Heart disease Father    Heart disease Maternal Grandmother    Heart disease Maternal Grandfather    Heart disease Paternal Grandmother    Heart disease Paternal Grandfather    Colon polyps Neg Hx    Colon cancer Neg Hx    Esophageal cancer Neg Hx    Rectal cancer Neg Hx    Stomach cancer Neg Hx    Social History  Socioeconomic History   Marital status: Married    Spouse name: Not on file   Number of children: Not on file   Years of education: Not on file   Highest education level: Not on file  Occupational History   Not on file  Tobacco Use   Smoking status: Never   Smokeless tobacco: Never  Vaping Use   Vaping Use: Never used  Substance and Sexual Activity   Alcohol use: Yes    Comment: occasional - couple of glasses of wine   Drug use: No   Sexual activity: Yes    Partners: Female  Other Topics Concern   Not on file  Social History Narrative   Not on file   Social Determinants of Health   Financial Resource Strain: Not on file  Food Insecurity: Not on file  Transportation Needs: Not on file  Physical Activity: Not on file  Stress: Not on file  Social Connections: Not on file  Intimate Partner Violence: Not on file    Physical Exam: Today's Vitals   08/12/22 1252  08/12/22 1255  BP: 133/78   Pulse: 61   Temp: (!) 96.8 F (36 C) (!) 96.8 F (36 C)  SpO2: 98%   Weight: 206 lb (93.4 kg)   Height: '6\' 2"'$  (1.88 m)    Body mass index is 26.45 kg/m. GEN: NAD EYE: Sclerae anicteric ENT: MMM CV: Non-tachycardic GI: Soft, NT/ND NEURO:  Alert & Oriented x 3  Lab Results: No results for input(s): "WBC", "HGB", "HCT", "PLT" in the last 72 hours. BMET No results for input(s): "NA", "K", "CL", "CO2", "GLUCOSE", "BUN", "CREATININE", "CALCIUM" in the last 72 hours. LFT No results for input(s): "PROT", "ALBUMIN", "AST", "ALT", "ALKPHOS", "BILITOT", "BILIDIR", "IBILI" in the last 72 hours. PT/INR No results for input(s): "LABPROT", "INR" in the last 72 hours.   Impression / Plan: This is a 56 y.o.male who presents for Colonoscopy for surveillance in setting of previous SSPs.  The risks and benefits of endoscopic evaluation/treatment were discussed with the patient and/or family; these include but are not limited to the risk of perforation, infection, bleeding, missed lesions, lack of diagnosis, severe illness requiring hospitalization, as well as anesthesia and sedation related illnesses.  The patient's history has been reviewed, patient examined, no change in status, and deemed stable for procedure.  The patient and/or family is agreeable to proceed.    Justice Britain, MD [facility]oscopy Office # [phone]

## 2022-08-12 NOTE — Op Note (Signed)
[facility] Patient Name: Charles Melendez Procedure Date: 08/12/2022 1:18 PM MRN: [id number] Endoscopist: Justice Britain , MD, [id number] Age: 56 Referring MD:  Date of Birth: April 09, 1966 Gender: Male Account #: [id number] Procedure:                Colonoscopy Indications:              Surveillance: Personal history of adenomatous                            polyps on last colonoscopy 5 years ago Medicines:                Monitored Anesthesia Care Procedure:                Pre-Anesthesia Assessment:                           - Prior to the procedure, a History and Physical                            was performed, and patient medications and                            allergies were reviewed. The patient's tolerance of                            previous anesthesia was also reviewed. The risks                            and benefits of the procedure and the sedation                            options and risks were discussed with the patient.                            All questions were answered, and informed consent                            was obtained. Prior Anticoagulants: The patient has                            taken no anticoagulant or antiplatelet agents                            except for aspirin. ASA Grade Assessment: I - A                            normal, healthy patient. After reviewing the risks                            and benefits, the patient was deemed in                            satisfactory condition to undergo the procedure.  After obtaining informed consent, the colonoscope                            was passed under direct vision. Throughout the                            procedure, the patient's blood pressure, pulse, and                            oxygen saturations were monitored continuously. The                            Colonoscope was introduced through the anus and                            advanced  to the 5 cm into the ileum. The                            colonoscopy was performed without difficulty. The                            patient tolerated the procedure. The quality of the                            bowel preparation was adequate. The terminal ileum,                            ileocecal valve, appendiceal orifice, and rectum                            were photographed. Scope In: 1:23:53 PM Scope Out: 1:41:02 PM Scope Withdrawal Time: 0 hours 12 minutes 55 seconds  Total Procedure Duration: 0 hours 17 minutes 9 seconds  Findings:                 The digital rectal exam findings include                            hemorrhoids. Pertinent negatives include no                            palpable rectal lesions.                           The terminal ileum and ileocecal valve appeared                            normal.                           Five sessile polyps were found in the descending                            colon (1), transverse colon (1) and ascending colon                            (  3). The polyps were 2 to 6 mm in size. These                            polyps were removed with a cold snare. Resection                            and retrieval were complete.                           Normal mucosa was found in the entire colon                            otherwise.                           Non-bleeding non-thrombosed internal hemorrhoids                            were found during retroflexion, during perianal                            exam and during digital exam. The hemorrhoids were                            Grade II (internal hemorrhoids that prolapse but                            reduce spontaneously). Complications:            No immediate complications. Estimated Blood Loss:     Estimated blood loss was minimal. Impression:               - Hemorrhoids found on digital rectal exam.                           - The examined portion of the ileum was  normal.                           - Five 2 to 6 mm polyps in the descending colon, in                            the transverse colon and in the ascending colon,                            removed with a cold snare. Resected and retrieved.                           - Normal mucosa in the entire examined colon                            otherwise.                           - Non-bleeding non-thrombosed internal hemorrhoids. Recommendation:           - The patient  will be observed post-procedure,                            until all discharge criteria are met.                           - Discharge patient to home.                           - Patient has a contact number available for                            emergencies. The signs and symptoms of potential                            delayed complications were discussed with the                            patient. Return to normal activities tomorrow.                            Written discharge instructions were provided to the                            patient.                           - High fiber diet.                           - Use FiberCon 1-2 tablets PO daily.                           - Continue present medications.                           - Await pathology results.                           - Repeat colonoscopy in 3 years for surveillance                            based on pathology results.                           - The findings and recommendations were discussed                            with the patient.                           - The findings and recommendations were discussed                            with the patient's family. Justice Britain, MD 08/12/2022 1:49:53 PM

## 2022-08-12 NOTE — Progress Notes (Signed)
Sedate, gd SR, tolerated procedure well, VSS, report to RN 

## 2022-08-12 NOTE — Progress Notes (Signed)
Called to room to assist during endoscopic procedure.  Patient ID and intended procedure confirmed with present staff. Received instructions for my participation in the procedure from the performing physician.  

## 2022-08-12 NOTE — Patient Instructions (Signed)
Follow a high fiber diet. Use Fibercon 1-2 tablets by mouth daily. Repeat Colonoscopy date to be determined based on pathology results. Handouts given on Colon polyps, High fiber diet and Hemorrhoids.  YOU HAD AN ENDOSCOPIC PROCEDURE TODAY AT [facility]:   Refer to the procedure report that was given to you for any specific questions about what was found during the examination.  If the procedure report does not answer your questions, please call your gastroenterologist to clarify.  If you requested that your care partner not be given the details of your procedure findings, then the procedure report has been included in a sealed envelope for you to review at your convenience later.  YOU SHOULD EXPECT: Some feelings of bloating in the abdomen. Passage of more gas than usual.  Walking can help get rid of the air that was put into your GI tract during the procedure and reduce the bloating. If you had a lower endoscopy (such as a colonoscopy or flexible sigmoidoscopy) you may notice spotting of blood in your stool or on the toilet paper. If you underwent a bowel prep for your procedure, you may not have a normal bowel movement for a few days.  Please Note:  You might notice some irritation and congestion in your nose or some drainage.  This is from the oxygen used during your procedure.  There is no need for concern and it should clear up in a day or so.  SYMPTOMS TO REPORT IMMEDIATELY:  Following lower endoscopy (colonoscopy or flexible sigmoidoscopy):  Excessive amounts of blood in the stool  Significant tenderness or worsening of abdominal pains  Swelling of the abdomen that is new, acute  Fever of 100F or higher  For urgent or emergent issues, a gastroenterologist can be reached at any hour by calling [phone]. Do not use MyChart messaging for urgent concerns.    DIET:  We do recommend a small meal at first, but then you may proceed to your regular diet.  Drink plenty of  fluids but you should avoid alcoholic beverages for 24 hours.  ACTIVITY:  You should plan to take it easy for the rest of today and you should NOT DRIVE or use heavy machinery until tomorrow (because of the sedation medicines used during the test).    FOLLOW UP: Our staff will call the number listed on your records the next business day following your procedure.  We will call around 7:15- 8:00 am to check on you and address any questions or concerns that you may have regarding the information given to you following your procedure. If we do not reach you, we will leave a message.     If any biopsies were taken you will be contacted by phone or by letter within the next 1-3 weeks.  Please call us at [phone] if you have not heard about the biopsies in 3 weeks.    SIGNATURES/CONFIDENTIALITY: You and/or your care partner have signed paperwork which will be entered into your electronic medical record.  These signatures attest to the fact that that the information above on your After Visit Summary has been reviewed and is understood.  Full responsibility of the confidentiality of this discharge information lies with you and/or your care-partner.

## 2022-08-17 ENCOUNTER — Telehealth: Payer: Self-pay

## 2022-08-17 NOTE — Telephone Encounter (Signed)
Attempted f/u call. No answer, left VM. 

## 2022-08-19 ENCOUNTER — Encounter: Payer: Self-pay | Admitting: Gastroenterology

## 2023-07-26 ENCOUNTER — Telehealth: Payer: Self-pay | Admitting: *Deleted

## 2023-07-26 NOTE — Progress Notes (Signed)
  Care Coordination  Outreach Note  07/26/2023 Name: Charles Melendez MRN: [id number] DOB: March 31, 1966   Care Coordination Outreach Attempts: An unsuccessful telephone outreach was attempted today to offer the patient information about available care coordination services.  Follow Up Plan:  Additional outreach attempts will be made to offer the patient care coordination information and services.   Encounter Outcome:  No Answer  Burman Nieves, CCMA Care Coordination Care Guide Direct Dial: [phone]

## 2023-07-28 NOTE — Progress Notes (Unsigned)
  Care Coordination  Outreach Note  07/28/2023 Name: Charles Melendez MRN: [id number] DOB: 11-24-1965   Care Coordination Outreach Attempts: A second unsuccessful outreach was attempted today to offer the patient with information about available care coordination services.  Follow Up Plan:  Additional outreach attempts will be made to offer the patient care coordination information and services.   Encounter Outcome:  No Answer  Burman Nieves, CCMA Care Coordination Care Guide Direct Dial: [phone]

## 2023-07-31 NOTE — Progress Notes (Signed)
  Care Coordination   Note   07/31/2023 Name: Charles Melendez MRN: [id number] DOB: [birth year]  Charles Melendez is a 57 y.o. year old male who sees Pcp, No for primary care. I reached out to [employer] by phone today to offer care coordination services.  Charles Melendez was given information about Care Coordination services today including:   The Care Coordination services include support from the care team which includes your Nurse Coordinator, Clinical Social Worker, or Pharmacist.  The Care Coordination team is here to help remove barriers to the health concerns and goals most important to you. Care Coordination services are voluntary, and the patient may decline or stop services at any time by request to their care team member.   Care Coordination Consent Status: Patient agreed to services and verbal consent obtained.   Follow up plan:  Telephone appointment with care coordination team member scheduled for:  08/01/2023  Encounter Outcome:  Patient Scheduled   Charles Melendez, [facility] Care Coordination Care Guide Direct Dial: [phone]

## 2023-08-01 ENCOUNTER — Ambulatory Visit: Payer: Self-pay | Admitting: *Deleted

## 2023-08-02 NOTE — Patient Instructions (Signed)
Visit Information  Thank you for taking time to visit with me today. Please don't hesitate to contact me if I can be of assistance to you.   Following are the goals we discussed today:   Goals Addressed             This Visit's Progress    Increase knowldege of the guardianship process       Activities and task to complete in order to accomplish goals.   TASK TO ACCOMPLISH GOAL Please continue to consult with your attorney regarding options for guardianship for your family member Self Support options  (continue ongoing MH follow up and discussions related but not limited to to natural consequences for family member and boundary setting)         Our next appointment is by telephone on 08/10/23 at 4pm  Please call the care guide team at [phone] if you need to cancel or reschedule your appointment.   If you are experiencing a Mental Health or Behavioral Health Crisis or need someone to talk to, please call 911   Patient verbalizes understanding of instructions and care plan provided today and agrees to view in MyChart. Active MyChart status and patient understanding of how to access instructions and care plan via MyChart confirmed with patient.     Telephone follow up appointment with care management team member scheduled for: 08/10/23  Verna Czech, LCSW [place]  [facility], [facility] Licensed Clinical Social Worker Care Coordinator  Direct Dial: [phone]

## 2023-08-10 ENCOUNTER — Ambulatory Visit: Payer: Self-pay | Admitting: *Deleted

## 2023-08-10 NOTE — Patient Outreach (Addendum)
  Care Coordination   Follow Up Visit Note   08/10/2023 Name: Gregori Okelley MRN: [id number] DOB: 04/05/1966  Numa Bachmann is a 57 y.o. year old male who sees Pcp, No for primary care. I spoke with  Louisa Second by phone today.  What matters to the patients health and wellness today? Community resource options for family member. Patient will plan to meet with an attorney through Zoom call on 08/11/23 to discuss guardianship process No additional community resource needs at this time.   Goals Addressed             This Visit's Progress    Increase knowldege of the guardianship process       Activities and task to complete in order to accomplish goals.   TASK TO ACCOMPLISH GOAL Please continue to consult with your attorney regarding options for guardianship for your family member          SDOH assessments and interventions completed:  No     Care Coordination Interventions:  Yes, provided  Interventions Today    Flowsheet Row Most Recent Value  Chronic Disease   Chronic disease during today's visit Other  [caregiver concerns]  General Interventions   General Interventions Discussed/Reviewed General Interventions Reviewed, [employer] that family member is now hospitalized-[facility] is now in place with plan to transfer to a behavioral health hospital once stable. adding structure once discharged also discussed ie [employer] as possible option]  Advanced Directive Interventions   Advanced Directives Discussed/Reviewed Guardianship  [Guardianship options continue to be discussed(emergency and general) family plans to meet with an attorney  on 08/11/23 to discuss options further]       Follow up plan: No further intervention required.   Encounter Outcome:  Patient Visit Completed

## 2023-08-10 NOTE — Patient Instructions (Signed)
Visit Information  Thank you for taking time to visit with me today. Please don't hesitate to contact me if I can be of assistance to you.   Following are the goals we discussed today:   Goals Addressed             This Visit's Progress    Increase knowldege of the guardianship process       Activities and task to complete in order to accomplish goals.   TASK TO ACCOMPLISH GOAL Please continue to consult with your attorney regarding options for guardianship for your family member         please call the Suicide and Crisis Lifeline: 988   Patient verbalizes understanding of instructions and care plan provided today and agrees to view in MyChart. Active MyChart status and patient understanding of how to access instructions and care plan via MyChart confirmed with patient.     Telephone follow up appointment with care management team member scheduled for:   Dineen Conradt, LCSW [place]  [facility], [facility] Licensed Clinical Social Worker Care Coordinator  Direct Dial: [phone]
